# Patient Record
Sex: Female | Born: 1985 | Race: Black or African American | Hispanic: No | Marital: Single | Smoking: Current every day smoker
Health system: Southern US, Community
[De-identification: ages and names within clinical notes are randomized; demographics above are authoritative.]

---

## 2016-06-03 ENCOUNTER — Encounter (HOSPITAL_COMMUNITY): Payer: Self-pay | Admitting: Family Medicine

## 2016-06-03 ENCOUNTER — Ambulatory Visit (INDEPENDENT_AMBULATORY_CARE_PROVIDER_SITE_OTHER): Payer: PRIVATE HEALTH INSURANCE

## 2016-06-03 ENCOUNTER — Ambulatory Visit (HOSPITAL_COMMUNITY)
Admission: EM | Admit: 2016-06-03 | Discharge: 2016-06-03 | Disposition: A | Discharge status: Home / Self Care | Payer: PRIVATE HEALTH INSURANCE | Attending: Family Medicine | Admitting: Family Medicine

## 2016-06-03 DIAGNOSIS — M25561 Pain in right knee: Secondary | ICD-10-CM | POA: Diagnosis not present

## 2016-06-03 MED ORDER — DICLOFENAC SODIUM 75 MG PO TBEC: 75.0000 mg | DELAYED_RELEASE_TABLET | Freq: Two times a day (BID) | ORAL | 0 refills | Status: DC
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
## 2016-06-03 NOTE — ED Notes (Signed)
Med  Knee   Sleeve  applied

## 2016-06-03 NOTE — Discharge Instructions (Signed)
There are no acute findings on your Xray such as a fracture or dislocation. Rest the affected knee, keep it elevated, apply ice 15 minutes at a time and use compression such as a brace or ACE bandages. I have written a prescription for diclofenac for pain. Take it twice a day. You may also use over the counter tylenol ever 4-6 hours for additional pain control if needed.  Should your symptoms fail to improve or worsen follow up with your primary care provider or return to clinic.

## 2016-06-04 ENCOUNTER — Telehealth (HOSPITAL_COMMUNITY): Payer: Self-pay | Admitting: Emergency Medicine

## 2016-06-04 NOTE — Telephone Encounter (Signed)
Discussed details of work note.  Patient to return to work on 1/11.  Patient agreeable

## 2016-07-29 ENCOUNTER — Emergency Department (HOSPITAL_COMMUNITY)
Admission: EM | Admit: 2016-07-29 | Discharge: 2016-07-29 | Disposition: A | Discharge status: Home / Self Care | Payer: PRIVATE HEALTH INSURANCE | Attending: Emergency Medicine | Admitting: Emergency Medicine

## 2016-07-29 ENCOUNTER — Emergency Department (HOSPITAL_COMMUNITY): Payer: PRIVATE HEALTH INSURANCE

## 2016-07-29 ENCOUNTER — Encounter (HOSPITAL_COMMUNITY): Payer: Self-pay | Admitting: Emergency Medicine

## 2016-07-29 DIAGNOSIS — J101 Influenza due to other identified influenza virus with other respiratory manifestations: Secondary | ICD-10-CM | POA: Insufficient documentation

## 2016-07-29 LAB — URINALYSIS, ROUTINE W REFLEX MICROSCOPIC
BILIRUBIN URINE: NEGATIVE
GLUCOSE, UA: NEGATIVE mg/dL
KETONES UR: NEGATIVE mg/dL
NITRITE: NEGATIVE
PROTEIN: NEGATIVE mg/dL
Specific Gravity, Urine: 1.016 (ref 1.005–1.030)
pH: 8 (ref 5.0–8.0)

## 2016-07-29 LAB — COMPREHENSIVE METABOLIC PANEL
ALBUMIN: 4.1 g/dL (ref 3.5–5.0)
ALK PHOS: 36 U/L — AB (ref 38–126)
ALT: 35 U/L (ref 14–54)
ANION GAP: 6 (ref 5–15)
AST: 40 U/L (ref 15–41)
BILIRUBIN TOTAL: 0.6 mg/dL (ref 0.3–1.2)
BUN: 10 mg/dL (ref 6–20)
CALCIUM: 9.5 mg/dL (ref 8.9–10.3)
CO2: 25 mmol/L (ref 22–32)
Chloride: 103 mmol/L (ref 101–111)
Creatinine, Ser: 0.98 mg/dL (ref 0.44–1.00)
GFR calc non Af Amer: >60 mL/min (ref >60)
Glucose, Bld: 91 mg/dL (ref 65–99)
POTASSIUM: 4.5 mmol/L (ref 3.5–5.1)
Sodium: 134 mmol/L — ABNORMAL LOW (ref 135–145)
TOTAL PROTEIN: 7.1 g/dL (ref 6.5–8.1)

## 2016-07-29 LAB — CBC
HEMATOCRIT: 37.5 % (ref 36.0–46.0)
HEMOGLOBIN: 12.3 g/dL (ref 12.0–15.0)
MCH: 27.7 pg (ref 26.0–34.0)
MCHC: 32.8 g/dL (ref 30.0–36.0)
MCV: 84.5 fL (ref 78.0–100.0)
Platelets: 236 10*3/uL (ref 150–400)
RBC: 4.44 MIL/uL (ref 3.87–5.11)
RDW: 15.3 % (ref 11.5–15.5)
WBC: 7.2 10*3/uL (ref 4.0–10.5)

## 2016-07-29 LAB — INFLUENZA PANEL BY PCR (TYPE A & B)
Influenza A By PCR: NEGATIVE
Influenza B By PCR: POSITIVE — AB

## 2016-07-29 LAB — RAPID STREP SCREEN (MED CTR MEBANE ONLY): STREPTOCOCCUS, GROUP A SCREEN (DIRECT): NEGATIVE

## 2016-07-29 LAB — I-STAT BETA HCG BLOOD, ED (MC, WL, AP ONLY): I-stat hCG, quantitative: <5 m[IU]/mL (ref <5)

## 2016-07-29 LAB — I-STAT CG4 LACTIC ACID, ED: Lactic Acid, Venous: 0.73 mmol/L (ref 0.5–1.9)

## 2016-07-29 LAB — LIPASE, BLOOD: Lipase: 25 U/L (ref 11–51)

## 2016-07-29 MED ORDER — OSELTAMIVIR PHOSPHATE 75 MG PO CAPS
75.0000 mg | ORAL_CAPSULE | Freq: Once | ORAL | Status: AC
  Administered 2016-07-29: 75 mg via ORAL
  Filled 2016-07-29: qty 1

## 2016-07-29 MED ORDER — SODIUM CHLORIDE 0.9 % IV BOLUS (SEPSIS)
1000.0000 mL | Freq: Once | INTRAVENOUS | Status: AC
  Administered 2016-07-29: 1000 mL via INTRAVENOUS

## 2016-07-29 MED ORDER — OSELTAMIVIR PHOSPHATE 75 MG PO CAPS: 75.0000 mg | ORAL_CAPSULE | Freq: Two times a day (BID) | ORAL | 0 refills | Status: DC

## 2016-07-29 MED ORDER — PROMETHAZINE HCL 25 MG PO TABS: 25.0000 mg | ORAL_TABLET | Freq: Four times a day (QID) | ORAL | 0 refills | Status: DC | PRN

## 2016-07-29 MED ORDER — HYDROCODONE-ACETAMINOPHEN 5-325 MG PO TABS: ORAL_TABLET | ORAL | 0 refills | Status: DC

## 2016-07-29 MED ORDER — ACETAMINOPHEN 325 MG PO TABS
650.0000 mg | ORAL_TABLET | Freq: Once | ORAL | Status: AC | PRN
  Administered 2016-07-29: 650 mg via ORAL
  Filled 2016-07-29: qty 2

## 2016-07-29 NOTE — ED Notes (Signed)
Pt tolerated PO challenge with no issues, no nausea. 

## 2016-07-29 NOTE — ED Provider Notes (Signed)
[facility]-EMERGENCY DEPT Provider Note   CSN: [id number] Arrival date & time: 07/29/16  0827     History   Chief Complaint Chief Complaint  Patient presents with  . Emesis  . Fever    HPI   Blood pressure 134/88, pulse 91, temperature 101.4 F (38.6 C), temperature source Oral, resp. rate 18, weight 95.4 kg, last menstrual period 07/29/2016, SpO2 100 %.  Stacey Roth is a 31 y.o. female no significant past medical history complaining of nonbloody, nonbilious, no coffee-ground emesis with associated non-melanotic stool with fever chills dry cough, myalgia onset 2 days ago. Positive sick contacts in that her children are also ill. She denies any focal chest pain, abdominal pain. On review of systems she endorses headache with no cervicalgia or stiff neck.  There are no active problems to display for this patient.   History reviewed. No pertinent surgical history.  OB History    No data available       Home Medications    Prior to Admission medications   Medication Sig Start Date End Date Taking? Authorizing Provider  HYDROcodone-acetaminophen (NORCO/VICODIN) 5-325 MG tablet Take 1-2 tablets by mouth every 6 hours as needed for pain and/or cough. 07/29/16   Wynona Duhamel, PA-C  oseltamivir (TAMIFLU) 75 MG capsule Take 1 capsule (75 mg total) by mouth every 12 (twelve) hours. 07/29/16   Lauralye Kinn, PA-C  promethazine (PHENERGAN) 25 MG tablet Take 1 tablet (25 mg total) by mouth every 6 (six) hours as needed for nausea or vomiting. 07/29/16   Joni Reining Desmund Elman, PA-C    Family History No family history on file.  Social History Social History  Substance Use Topics  . Smoking status: Never Smoker  . Smokeless tobacco: Never Used  . Alcohol use Not on file     Allergies   Lac bovis   Review of Systems Review of Systems  10 systems reviewed and found to be negative, except as noted in the HPI.   Physical Exam Updated Vital Signs BP 131/89 (BP Location: Right  Arm)   Pulse 86   Temp 99.8 F (37.7 C) (Oral)   Resp 18   Wt 95.4 kg   LMP 07/29/2016   SpO2 99%   Physical Exam  Constitutional: She appears well-developed and well-nourished.  HENT:  Head: Normocephalic.  Right Ear: External ear normal.  Left Ear: External ear normal.  Mouth/Throat: Oropharynx is clear and moist. No oropharyngeal exudate.  No drooling or stridor. Posterior pharynx mildly erythematous no significant tonsillar hypertrophy. No exudate. Soft palate rises symmetrically. No TTP or induration under tongue.   No tenderness to palpation of frontal or bilateral maxillary sinuses.  Mild mucosal edema in the nares with scant rhinorrhea.  Bilateral tympanic membranes with normal architecture and good light reflex.    Eyes: Conjunctivae and EOM are normal. Pupils are equal, round, and reactive to light.  Neck: Normal range of motion. Neck supple.  Cardiovascular: Normal rate and regular rhythm.   Pulmonary/Chest: Effort normal and breath sounds normal. No stridor. No respiratory distress. She has no wheezes. She has no rales. She exhibits no tenderness.  Abdominal: Soft. There is no tenderness. There is no rebound and no guarding.  Nursing note and vitals reviewed.    ED Treatments / Results  Labs (all labs ordered are listed, but only abnormal results are displayed) Labs Reviewed  COMPREHENSIVE METABOLIC PANEL - Abnormal; Notable for the following:       Result Value   Sodium 134 (*)  Alkaline Phosphatase 36 (*)    All other components within normal limits  URINALYSIS, ROUTINE W REFLEX MICROSCOPIC - Abnormal; Notable for the following:    APPearance HAZY (*)    Hgb urine dipstick MODERATE (*)    Leukocytes, UA TRACE (*)    Bacteria, UA RARE (*)    Squamous Epithelial / LPF 6-30 (*)    All other components within normal limits  INFLUENZA PANEL BY PCR (TYPE A & B) - Abnormal; Notable for the following:    Influenza B By PCR POSITIVE (*)    All other  components within normal limits  RAPID STREP SCREEN (NOT AT [facility])  CULTURE, GROUP A STREP ([facility])  LIPASE, BLOOD  CBC  I-STAT CG4 LACTIC ACID, ED  I-STAT BETA HCG BLOOD, ED ([facility], [facility], [facility] ONLY)  I-STAT CG4 LACTIC ACID, ED    EKG  EKG Interpretation None       Radiology Dg Chest 2 View  Result Date: 07/29/2016 CLINICAL DATA:  Dry cough with SOB. Pt gets winded upon any physical exertion. Pt has gastric reflux. Not taking any prescribed meds. No hx of HTN. Not diabetic. Nonsmoker. Pt has sore throat as well, say it feels swollen. EXAM: CHEST  2 VIEW COMPARISON:  None. FINDINGS: The heart size and mediastinal contours are within normal limits. Both lungs are clear. The visualized skeletal structures are unremarkable. IMPRESSION: No active cardiopulmonary disease. No evidence of pneumonia or pulmonary edema. Electronically Signed   By: Bary RichardStan  Maynard M.D.   On: 07/29/2016 09:09    Procedures Procedures (including critical care time)  Medications Ordered in ED Medications  oseltamivir (TAMIFLU) capsule 75 mg (not administered)  acetaminophen (TYLENOL) tablet 650 mg (650 mg Oral Given 07/29/16 0917)  sodium chloride 0.9 % bolus 1,000 mL (0 mLs Intravenous Stopped 07/29/16 1247)     Initial Impression / Assessment and Plan / ED Course  I have reviewed the triage vital signs and the nursing notes.  Pertinent labs & imaging results that were available during my care of the patient were reviewed by me and considered in my medical decision making (see chart for details).     Vitals:   07/29/16 0840 07/29/16 0948 07/29/16 1044 07/29/16 1124  BP: 134/88  121/75 131/89  Pulse: 91  90 86  Resp: 18  18 18   Temp: 101.4 F (38.6 C) 101.4 F (38.6 C) 99.3 F (37.4 C) 99.8 F (37.7 C)  TempSrc: Oral Oral Oral Oral  SpO2: 100%  100% 99%  Weight: 95.4 kg       Medications  oseltamivir (TAMIFLU) capsule 75 mg (not administered)  acetaminophen (TYLENOL) tablet 650 mg (650 mg Oral Given 07/29/16  0917)  sodium chloride 0.9 % bolus 1,000 mL (0 mLs Intravenous Stopped 07/29/16 1247)    [name] is 31 y.o. female presenting with nausea vomiting diarrhea and upper respiratory infection consistent with influenza, patient is febrile here with a fever of over 101, is appropriately defervesced after acetaminophen. She is tolerating By mouth's. Blood work reassuring, chest x-ray clear, she saturating well on room air. Flu be positive. Patient counseled on infection control precautions verbalized understanding and teach back technique.  Evaluation does not show pathology that would require ongoing emergent intervention or inpatient treatment. Pt is hemodynamically stable and mentating appropriately. Discussed findings and plan with patient/guardian, who agrees with care plan. All questions answered. Return precautions discussed and outpatient follow up given.      Final Clinical Impressions(s) / ED Diagnoses  Final diagnoses:  Influenza B    New Prescriptions New Prescriptions   HYDROCODONE-ACETAMINOPHEN (NORCO/VICODIN) 5-325 MG TABLET    Take 1-2 tablets by mouth every 6 hours as needed for pain and/or cough.   OSELTAMIVIR (TAMIFLU) 75 MG CAPSULE    Take 1 capsule (75 mg total) by mouth every 12 (twelve) hours.   PROMETHAZINE (PHENERGAN) 25 MG TABLET    Take 1 tablet (25 mg total) by mouth every 6 (six) hours as needed for nausea or vomiting.     Wynetta Emery, PA-C 07/29/16 1248    Canary Brim Tegeler, MD 07/29/16 [id number]

## 2016-07-29 NOTE — ED Notes (Signed)
Water offered to pt for PO challenge. 

## 2016-07-29 NOTE — Discharge Instructions (Signed)

## 2016-07-29 NOTE — ED Triage Notes (Signed)
Pt reports emesis and fever x 2 days. Chest congestions , dry cough x 2 days. Chest pain when coughing. Alert and oriented x 4.

## 2016-07-31 LAB — CULTURE, GROUP A STREP (THRC)

## 2017-03-10 ENCOUNTER — Emergency Department (HOSPITAL_COMMUNITY)
Admission: EM | Admit: 2017-03-10 | Discharge: 2017-03-10 | Disposition: A | Discharge status: Home / Self Care | Payer: PRIVATE HEALTH INSURANCE | Attending: Emergency Medicine | Admitting: Emergency Medicine

## 2017-03-10 ENCOUNTER — Encounter (HOSPITAL_COMMUNITY): Payer: Self-pay

## 2017-03-10 DIAGNOSIS — K5900 Constipation, unspecified: Secondary | ICD-10-CM | POA: Insufficient documentation

## 2017-03-10 DIAGNOSIS — K649 Unspecified hemorrhoids: Secondary | ICD-10-CM | POA: Insufficient documentation

## 2017-03-10 MED ORDER — LIDOCAINE 5 % EX OINT: 1.0000 "application " | TOPICAL_OINTMENT | CUTANEOUS | 0 refills | Status: DC | PRN

## 2017-03-10 MED ORDER — BISACODYL 5 MG PO TBEC: 5.0000 mg | DELAYED_RELEASE_TABLET | ORAL | 0 refills | Status: DC

## 2017-03-10 MED ORDER — HYDROCORTISONE 2.5 % RE CREA: 1.0000 "application " | TOPICAL_CREAM | Freq: Two times a day (BID) | RECTAL | 0 refills | Status: DC

## 2017-03-10 NOTE — ED Provider Notes (Signed)
[facility] EMERGENCY DEPARTMENT Provider Note   CSN: [id number] Arrival date & time: 03/10/17  1331     History   Chief Complaint No chief complaint on file.   HPI  Stacey Roth is a 31 y.o. Female with one previous episode of hemorrhoids, presents complaining of 2 days of rectal pain. Pt describes pain as a constant ache and sensation that something is poking out of her rectum. Pt denies any rectal bleeding. Pain is worse with prolonged sitting and wiping, no meds PTA to treat symptoms. Patient reports she often strains a lot when trying to have a bowel movement and spends a prolonged amount of time of the toilet. Pt states she is frequently constipated and BMs are hard. Denies any rectal or vaginal discharge, no abdominal pain.      History reviewed. No pertinent past medical history.  There are no active problems to display for this patient.   History reviewed. No pertinent surgical history.  OB History    No data available       Home Medications    Prior to Admission medications   Medication Sig Start Date End Date Taking? Authorizing Provider  HYDROcodone-acetaminophen (NORCO/VICODIN) 5-325 MG tablet Take 1-2 tablets by mouth every 6 hours as needed for pain and/or cough. 07/29/16   Pisciotta, Joni Reining, PA-C  oseltamivir (TAMIFLU) 75 MG capsule Take 1 capsule (75 mg total) by mouth every 12 (twelve) hours. 07/29/16   Pisciotta, Joni Reining, PA-C  promethazine (PHENERGAN) 25 MG tablet Take 1 tablet (25 mg total) by mouth every 6 (six) hours as needed for nausea or vomiting. 07/29/16   Pisciotta, Mardella Layman    Family History No family history on file.  Social History Social History  Substance Use Topics  . Smoking status: Never Smoker  . Smokeless tobacco: Never Used  . Alcohol use Not on file     Allergies   Lac bovis   Review of Systems Review of Systems  Constitutional: Negative for chills and fever.  Gastrointestinal: Positive for  constipation and rectal pain. Negative for abdominal pain, anal bleeding, blood in stool, nausea and vomiting.  Genitourinary: Negative for dysuria.  Musculoskeletal: Negative for back pain.     Physical Exam Updated Vital Signs BP (!) 127/97 (BP Location: Left Arm)   Pulse 81   Temp 98.6 F (37 C) (Oral)   Resp 16   Ht  (1.676 m)   Wt 108.4 kg (239 lb)   SpO2 100%   BMI 38.58 kg/m   Physical Exam  Constitutional: She appears well-developed and well-nourished. No distress.  HENT:  Head: Normocephalic and atraumatic.  Eyes: Right eye exhibits no discharge. Left eye exhibits no discharge.  Pulmonary/Chest: Effort normal. No respiratory distress.  Genitourinary:     Neurological: She is alert. Coordination normal.  Skin: She is not diaphoretic.  Psychiatric: She has a normal mood and affect. Her behavior is normal.  Nursing note and vitals reviewed.    ED Treatments / Results  Labs (all labs ordered are listed, but only abnormal results are displayed) Labs Reviewed - No data to display  EKG  EKG Interpretation None       Radiology No results found.  Procedures Procedures (including critical care time)  Medications Ordered in ED Medications - No data to display   Initial Impression / Assessment and Plan / ED Course  I have reviewed the triage vital signs and the nursing notes.  Pertinent labs & imaging results that were  available during my care of the patient were reviewed by me and considered in my medical decision making (see chart for details)  Pt presents with 2 days of rectal pain, no bleeding. Vitals normal and pt well-appearing. Presentation consistent with hemorrhoid, no evidence of anal fissure, or perirectal abscess. Pt stable for discharge home with stool softeners, lidocaine and hydrocortisone creams. Encouraged pt to do sitz baths 3-4 times a day. Pt to follow up with primary doctor if symptoms not improving in 5-7 days, stressed importance  of soft stools and increasing fiber intake. Return precautions provided and pt expressed understanding.  Final Clinical Impressions(s) / ED Diagnoses   Final diagnoses:  Hemorrhoids, unspecified hemorrhoid type    New Prescriptions Discharge Medication List as of 03/10/2017  4:00 PM    START taking these medications   Details  bisacodyl (DULCOLAX) 5 MG EC tablet Take 1 tablet (5 mg total) by mouth 1 day or 1 dose., Starting Tue 03/10/2017, Print    hydrocortisone (ANUSOL-HC) 2.5 % rectal cream Place 1 application rectally 2 (two) times daily., Starting Tue 03/10/2017, Print    lidocaine (XYLOCAINE) 5 % ointment Apply 1 application topically as needed., Starting Tue 03/10/2017, Print         Dartha Lodge, PA-C 03/11/17 1324    Rolland Porter, MD 03/15/17 2354

## 2017-03-10 NOTE — ED Triage Notes (Signed)
Patient here with recurrent hemorrhoid. Reports pain with wiping, no bleeding, NAD

## 2017-03-10 NOTE — Discharge Instructions (Signed)
Use lidocaine and hydrocortisone creams, as well as stool softener, you may also use Miralax to help with constipation. Use warm water soaks 3-4 times a day. If symptoms are not improving please follow up with community wellness center.  Contact a doctor if: You have any of these: Pain and swelling that do not get better with treatment or medicine. Bleeding that will not stop. Trouble pooping or you cannot poop. Pain or swelling outside the area of the hemorrhoids.

## 2017-04-30 ENCOUNTER — Emergency Department (HOSPITAL_COMMUNITY)
Admission: EM | Admit: 2017-04-30 | Discharge: 2017-04-30 | Disposition: A | Discharge status: Home / Self Care | Payer: PRIVATE HEALTH INSURANCE | Attending: Emergency Medicine | Admitting: Emergency Medicine

## 2017-04-30 ENCOUNTER — Encounter (HOSPITAL_COMMUNITY): Payer: Self-pay | Admitting: Emergency Medicine

## 2017-04-30 DIAGNOSIS — F17210 Nicotine dependence, cigarettes, uncomplicated: Secondary | ICD-10-CM | POA: Diagnosis not present

## 2017-04-30 DIAGNOSIS — M25561 Pain in right knee: Secondary | ICD-10-CM | POA: Diagnosis not present

## 2017-04-30 MED ORDER — IBUPROFEN 200 MG PO TABS: 600.0000 mg | ORAL_TABLET | Freq: Once | ORAL | Status: DC

## 2017-04-30 MED ORDER — ACETAMINOPHEN 325 MG PO TABS
650.0000 mg | ORAL_TABLET | Freq: Once | ORAL | Status: DC
Start: 2017-04-30 — End: 2017-04-30

## 2017-04-30 NOTE — ED Triage Notes (Signed)
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 Patient denies any injuries to cause pain. Patient reports now having pain in left knees and feet after she works.  Patient tried brace, elevating leg, icing but nothing helps with pain.

## 2017-04-30 NOTE — ED Provider Notes (Signed)
[facility]-EMERGENCY DEPT Provider Note   CSN: [id number] Arrival date & time: 04/30/17  0732     History   Chief Complaint Chief Complaint  Patient presents with  . Knee Pain    HPI Stacey Roth is a 31 y.o. female.  HPI Patient is a 31 year old female who presents to the emergency department with complaints of right knee pain and intermittent right knee swelling.  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  She has not tried any medications prior to arrival.  She does not have a primary care physician.  She is currently without health insurance.  She is never seen a sports medicine physician or an orthopedic surgeon regarding her right knee pain.  She was seen in the emergency department in January 2018 with right knee pain in her x-ray at that time demonstrates no signs of arthritis.  No fevers or chills.  No history of gout.      History reviewed. No pertinent past medical history.  There are no active problems to display for this patient.   History reviewed. No pertinent surgical history.  OB History    No data available       Home Medications    Prior to Admission medications   Medication Sig Start Date End Date Taking? Authorizing Provider  bisacodyl (DULCOLAX) 5 MG EC tablet Take 1 tablet (5 mg total) by mouth 1 day or 1 dose. 03/10/17   Dartha LodgeFord, Kelsey N, PA-C  HYDROcodone-acetaminophen (NORCO/VICODIN) 5-325 MG tablet Take 1-2 tablets by mouth every 6 hours as needed for pain and/or cough. 07/29/16   Pisciotta, Joni ReiningNicole, PA-C  hydrocortisone (ANUSOL-HC) 2.5 % rectal cream Place 1 application rectally 2 (two) times daily. 03/10/17   Dartha LodgeFord, Kelsey N, PA-C  lidocaine (XYLOCAINE) 5 % ointment Apply 1 application topically as needed. 03/10/17   Dartha LodgeFord, Kelsey N, PA-C  oseltamivir (TAMIFLU) 75 MG capsule Take 1 capsule (75 mg total) by mouth every 12  (twelve) hours. 07/29/16   Pisciotta, Joni ReiningNicole, PA-C  promethazine (PHENERGAN) 25 MG tablet Take 1 tablet (25 mg total) by mouth every 6 (six) hours as needed for nausea or vomiting. 07/29/16   Pisciotta, Mardella LaymanNicole, PA-C    Family History No family history on file.  Social History Social History   Tobacco Use  . Smoking status: Current Every Day Smoker    Types: Cigarettes  . Smokeless tobacco: Never Used  Substance Use Topics  . Alcohol use: Yes    Comment: occasional   . Drug use: Not on file     Allergies   Lac bovis   Review of Systems Review of Systems  All other systems reviewed and are negative.    Physical Exam Updated Vital Signs BP (!) 117/95 (BP Location: Left Arm)   Pulse 85   Temp 97.8 F (36.6 C) (Oral)   Resp 18   Ht 5\' 6"  (1.676 m)   Wt 108.4 kg (239 lb)   LMP 04/05/2017   SpO2 99%   BMI 38.58 kg/m   Physical Exam  Constitutional: She is oriented to person, place, and time. She appears well-developed and well-nourished. No distress.  HENT:  Head: Normocephalic and atraumatic.  Eyes: EOM are normal.  Neck: Normal range of motion.  Pulmonary/Chest: Effort normal.  Abdominal: Soft.  Musculoskeletal:  Small joint effusion right knee.  Full range of motion of  right hip, right knee, right ankle.  No erythema or warmth of the right knee.  No mechanical symptoms elicited  Neurological: She is alert and oriented to person, place, and time.  Skin: Skin is warm and dry.  Psychiatric: She has a normal mood and affect. Judgment normal.  Nursing note and vitals reviewed.    ED Treatments / Results  Labs (all labs ordered are listed, but only abnormal results are displayed) Labs Reviewed - No data to display  EKG  EKG Interpretation None       Radiology No results found.  Procedures Procedures (including critical care time)  Medications Ordered in ED Medications - No data to display   Initial Impression / Assessment and Plan / ED Course  I  have reviewed the triage vital signs and the nursing notes.  Pertinent labs & imaging results that were available during my care of the patient were reviewed by me and considered in my medical decision making (see chart for details).     Nonspecific right knee pain.  Outpatient primary care follow-up.  [facility].  She will likely need evaluation by sports medicine physician after referral by primary care physician.  Recommended anti-inflammatories, rest, ice, elevation.  No trauma.  No indication for acute x-ray today  Final Clinical Impressions(s) / ED Diagnoses   Final diagnoses:  Acute pain of right knee    ED Discharge Orders    None       Azalia Bilisampos, Jaimon Bugaj, MD 04/30/17 [phone]

## 2017-06-22 ENCOUNTER — Emergency Department (HOSPITAL_COMMUNITY): Payer: Self-pay

## 2017-06-22 ENCOUNTER — Encounter (HOSPITAL_COMMUNITY): Payer: Self-pay

## 2017-06-22 ENCOUNTER — Other Ambulatory Visit: Payer: Self-pay

## 2017-06-22 DIAGNOSIS — Z79899 Other long term (current) drug therapy: Secondary | ICD-10-CM | POA: Insufficient documentation

## 2017-06-22 DIAGNOSIS — J189 Pneumonia, unspecified organism: Secondary | ICD-10-CM | POA: Insufficient documentation

## 2017-06-22 DIAGNOSIS — F17210 Nicotine dependence, cigarettes, uncomplicated: Secondary | ICD-10-CM | POA: Insufficient documentation

## 2017-06-22 LAB — BASIC METABOLIC PANEL
ANION GAP: 10 (ref 5–15)
BUN: 13 mg/dL (ref 6–20)
CO2: 24 mmol/L (ref 22–32)
Calcium: 9.5 mg/dL (ref 8.9–10.3)
Chloride: 103 mmol/L (ref 101–111)
Creatinine, Ser: 0.84 mg/dL (ref 0.44–1.00)
GFR calc Af Amer: >60 mL/min (ref >60)
Glucose, Bld: 108 mg/dL — ABNORMAL HIGH (ref 65–99)
POTASSIUM: 3.9 mmol/L (ref 3.5–5.1)
SODIUM: 137 mmol/L (ref 135–145)

## 2017-06-22 LAB — CBC
HEMATOCRIT: 38.6 % (ref 36.0–46.0)
Hemoglobin: 12.6 g/dL (ref 12.0–15.0)
MCH: 28 pg (ref 26.0–34.0)
MCHC: 32.6 g/dL (ref 30.0–36.0)
MCV: 85.8 fL (ref 78.0–100.0)
Platelets: 266 10*3/uL (ref 150–400)
RBC: 4.5 MIL/uL (ref 3.87–5.11)
RDW: 15.8 % — AB (ref 11.5–15.5)
WBC: 8.1 10*3/uL (ref 4.0–10.5)

## 2017-06-22 LAB — I-STAT TROPONIN, ED: Troponin i, poc: 0 ng/mL (ref 0.00–0.08)

## 2017-06-22 LAB — I-STAT BETA HCG BLOOD, ED (MC, WL, AP ONLY)

## 2017-06-22 NOTE — ED Triage Notes (Signed)
Pt reports that for the past week she has had central CP with SOB. No other cardiac symptoms. 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

## 2017-06-23 ENCOUNTER — Emergency Department (HOSPITAL_COMMUNITY)
Admission: EM | Admit: 2017-06-23 | Discharge: 2017-06-23 | Disposition: A | Discharge status: Home / Self Care | Payer: Self-pay | Attending: Emergency Medicine | Admitting: Emergency Medicine

## 2017-06-23 DIAGNOSIS — J189 Pneumonia, unspecified organism: Secondary | ICD-10-CM

## 2017-06-23 NOTE — ED Provider Notes (Signed)
[facility] EMERGENCY DEPARTMENT Provider Note   CSN: [id number] Arrival date & time: 06/22/17  2236     History   Chief Complaint Chief Complaint  Patient presents with  . Chest Pain    HPI Stacey Roth is a 32 y.o. female.   32 year old female with no significant past medical history presents to the emergency department for evaluation of chest pain.  She reports central chest pain which begins in her right upper chest and migrates to her left chest.  She has had intermittent symptoms over the past week.  She notices worsening symptoms when she is at work.  She states that she works at [employer] and is also using many chemicals.  Her symptoms worsen when she is not wearing a mask.  She notes associated shortness of breath.  She has improvement to her pain and breathing when she is able to go outside in the fresh air.  She denies taking any medications for her symptoms.  No hemoptysis, leg swelling, recent surgeries or hospitalizations, use of birth control.  She further denies congestion and fevers.  She is a daily smoker.  No family history of ACS, per patient.  No personal history of hypertension, diabetes, dyslipidemia.      History reviewed. No pertinent past medical history.  There are no active problems to display for this patient.   History reviewed. No pertinent surgical history.  OB History    No data available       Home Medications    Prior to Admission medications   Medication Sig Start Date End Date Taking? Authorizing Provider  bisacodyl (DULCOLAX) 5 MG EC tablet Take 1 tablet (5 mg total) by mouth 1 day or 1 dose. 03/10/17   Dartha Lodge, PA-C  HYDROcodone-acetaminophen (NORCO/VICODIN) 5-325 MG tablet Take 1-2 tablets by mouth every 6 hours as needed for pain and/or cough. 07/29/16   Pisciotta, Joni Reining, PA-C  hydrocortisone (ANUSOL-HC) 2.5 % rectal cream Place 1 application rectally 2 (two) times daily. 03/10/17   Dartha Lodge,  PA-C  lidocaine (XYLOCAINE) 5 % ointment Apply 1 application topically as needed. 03/10/17   Dartha Lodge, PA-C  oseltamivir (TAMIFLU) 75 MG capsule Take 1 capsule (75 mg total) by mouth every 12 (twelve) hours. 07/29/16   Pisciotta, Joni Reining, PA-C  promethazine (PHENERGAN) 25 MG tablet Take 1 tablet (25 mg total) by mouth every 6 (six) hours as needed for nausea or vomiting. 07/29/16   Pisciotta, Mardella Layman    Family History No family history on file.  Social History Social History   Tobacco Use  . Smoking status: Current Every Day Smoker    Types: Cigarettes  . Smokeless tobacco: Never Used  Substance Use Topics  . Alcohol use: Yes    Comment: occasional   . Drug use: Not on file     Allergies   Lac bovis   Review of Systems Review of Systems Ten systems reviewed and are negative for acute change, except as noted in the HPI.    Physical Exam Updated Vital Signs BP 139/79   Pulse 74   Temp 98.3 F (36.8 C) (Oral)   Resp 17   SpO2 98%   Physical Exam  Constitutional: She is oriented to person, place, and time. She appears well-developed and well-nourished. No distress.  Nontoxic appearing and in NAD  HENT:  Head: Normocephalic and atraumatic.  Eyes: Conjunctivae and EOM are normal. No scleral icterus.  Neck: Normal range of motion.  Cardiovascular: Normal rate, regular rhythm and intact distal pulses.  Pulmonary/Chest: Effort normal. No stridor. No respiratory distress. She has no wheezes. She has no rales.  Lungs CTAB. Respirations even and unlabored.  Musculoskeletal: Normal range of motion.  Neurological: She is alert and oriented to person, place, and time. She exhibits normal muscle tone. Coordination normal.  Skin: Skin is warm and dry. No rash noted. She is not diaphoretic. No erythema. No pallor.  Psychiatric: She has a normal mood and affect. Her behavior is normal.  Nursing note and vitals reviewed.    ED Treatments / Results  Labs (all labs  ordered are listed, but only abnormal results are displayed) Labs Reviewed  BASIC METABOLIC PANEL - Abnormal; Notable for the following components:      Result Value   Glucose, Bld 108 (*)    All other components within normal limits  CBC - Abnormal; Notable for the following components:   RDW 15.8 (*)    All other components within normal limits  I-STAT TROPONIN, ED  I-STAT BETA HCG BLOOD, ED ([facility], [facility], [facility] ONLY)    EKG  EKG Interpretation  Date/Time:  Monday June 22 2017 22:41:56 EST Ventricular Rate:  83 PR Interval:  138 QRS Duration: 82 QT Interval:  354 QTC Calculation: 415 R Axis:   43 Text Interpretation:  Normal sinus rhythm Nonspecific T wave abnormality Abnormal ECG No previous tracing Confirmed by Gilda CreasePollina, Christopher J [id number]) on 06/23/2017 3:15:28 AM       Radiology Dg Chest 2 View  Result Date: 06/22/2017 CLINICAL DATA:  Central chest pain and shortness of breath for a week. EXAM: CHEST  2 VIEW COMPARISON:  07/29/2016 FINDINGS: Shallow inspiration. The heart size and mediastinal contours are within normal limits. Both lungs are clear. The visualized skeletal structures are unremarkable. IMPRESSION: No active cardiopulmonary disease. Electronically Signed   By: Burman NievesWilliam  Stevens M.D.   On: 06/22/2017 23:32    Procedures Procedures (including critical care time)  Medications Ordered in ED Medications - No data to display   Initial Impression / Assessment and Plan / ED Course  I have reviewed the triage vital signs and the nursing notes.  Pertinent labs & imaging results that were available during my care of the patient were reviewed by me and considered in my medical decision making (see chart for details).     32 year old female presents to the emergency department for evaluation of chest pain.  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  Onset was 1 week ago.  Patient with heart score consistent with low risk of acute coronary event.   Her cardiac workup today is reassuring with a negative troponin.  EKG shows no signs of acute ischemia.  Chest x-ray offers further reassurance without significant cardiopulmonary abnormality.  Patient has no mediastinal widening to suggest dissection.  She is PERC negative with low concern for pulmonary embolus at this time.  Symptoms most consistent with pneumonitis.  Have discussed use of better fitting masks when working.  She has been given a Engineer, building servicesfinancial resource guide for assistance in finding a primary care doctor for follow-up.  Return precautions discussed and provided. Patient discharged in stable condition with no unaddressed concerns.   Final Clinical Impressions(s) / ED Diagnoses   Final diagnoses:  Pneumonitis    ED Discharge Orders    None       Antony MaduraHumes, Zhane Donlan, PA-C 06/23/17 0319    Gilda CreasePollina, Christopher J, MD 06/23/17 [phone]

## 2017-06-23 NOTE — ED Notes (Signed)
Pt. Ambulated with steady gait from waiting area to bed.

## 2017-07-10 ENCOUNTER — Encounter: Payer: Self-pay | Admitting: Family Medicine

## 2017-07-10 ENCOUNTER — Ambulatory Visit (INDEPENDENT_AMBULATORY_CARE_PROVIDER_SITE_OTHER): Payer: Self-pay | Admitting: Family Medicine

## 2017-07-10 VITALS — BP 132/88 | Ht 66.0 in | Wt 230.0 lb

## 2017-07-10 DIAGNOSIS — M25531 Pain in right wrist: Secondary | ICD-10-CM

## 2017-07-10 DIAGNOSIS — M9262 Juvenile osteochondrosis of tarsus, left ankle: Secondary | ICD-10-CM

## 2017-07-10 MED ORDER — DICLOFENAC SODIUM 75 MG PO TBEC: 75.0000 mg | DELAYED_RELEASE_TABLET | Freq: Two times a day (BID) | ORAL | 1 refills | Status: DC

## 2017-07-10 NOTE — Progress Notes (Signed)
Chief complaint: Right wrist pain x 2 weeks  History of present illness: Stacey Roth is a 32 year old right-hand-dominant female who presents to sports medicine office today with chief plaint of right wrist and forearm pain.  She reports that symptoms have been present for approximately 2 weeks.  She does not report of any specific inciting incident, trauma, or injury to explain the pain.  She does work in Diet[employer], reports of repetitive wrist and hand movements.  She reports that these are particularly aggravating factors for her.  She reports that symptoms have been getting worse over the last 2 weeks.  She reports that pain today is at a 6/10.  She reports of also having pain along her right forearm.  She points mostly to the flexor surface of her wrist and forearm as to where she feels the pain.  She does not report of any elbow pain.  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  She does not report of any numbness, tingling, or burning paresthesias.  She does not report of any swelling, warmth, erythema, or ecchymosis in her right arm, wrist, hand, or fingers.  She reports that she did chat with one of her friends, who is a Engineer, civil (consulting), and she recommended her taking some ibuprofen.  She reports that she did take a couple of tablets of ibuprofen, which did not help.  She reports she was given a small wrist brace, which did not help. She reports rest otherwise is only alleviating factor. She does not report of any previous injury to her right wrist.  She does not report of any weakness in her right arm or any weakness in grip strength.  She has not used any ice or heat.  She also reports feeling this pain diffusely in her hand.  She reports occasionally having symptoms on the left side, but nowhere near as bad as the right side.  Review of systems:  As stated above  Her past medical history, surgical history, family history, and social history obtained and  reviewed.  She is otherwise healthy, no medical conditions. She is obese.  She does report of current tobacco use.  She does not report of any procedures/hospitalizations.  She does not report a family history of hypertension or type 2 diabetes.  Medication and allergies reviewed, updated in EMR.  Physical exam: Vital signs are reviewed and are documented in the chart Gen.: Alert, oriented, appears stated age, in no apparent distress HEENT: Moist oral mucosa Respiratory: Normal respirations, able to speak in full sentences Cardiac: Regular rate, distal pulses 2+ Integumentary: No rashes on visible skin:  Neurologic: She does have full strength with elbow flexion, elbow extension, wrist flexion, wrist extension, elbow pronation, elbow supination, finger abduction, finger adduction, and thumb opposition bilaterally, would categorize strength as 5/5, sensation 2+ in bilateral upper extremities Psych: Normal affect, mood is described as good Musculoskeletal: Inspection of right wrist reveals no obvious deformity or muscle atrophy, no warmth, erythema, ecchymosis, or effusion, she is tender to palpation with along the palmar/flexor surface of the right wrist, on both the radial side and the ulnar side, she does have slight tenderness on the dorsal aspect of the right wrist, on both the radial side and the ulnar side as well, she does have diffuse tenderness to palpation along the carpal bones and metacarpals on the right hand, she does have tenderness to palpation along the flexor/volar aspect of the right forearm, she does have pain with  wrist flexion, wrist extension, radial deviation of right wrist, ulnar deviation of right wrist, elbow flexion, elbow extension, pronation, and supination on the right side, grind test negative, Finkelstein positive, Tinel sign negative in the carpal tunnel and Guyon canal, Phalen sign negative; also has Haglund's deformity in the left heel  Assessment and plan: 1. Right  wrist pain, suspect to FCR and FCU tendinopathy 2.  Left heel Haglund's deformity  Plan:  Discussed with Jerney today that it seems that her wrist pain is consistent with overuse and tendinopathy of the FCR and FCU, as she is specifically tender along the flexor surface of the right arm and does have pain with wrist flexion and wrist extension, mainly with wrist flexion though.  I do not see any evidence on history or physical exam today to suggest any fracture or any bony abnormalities otherwise.  I discussed first step in management would be to have her fitted for a better wrist brace.  This was fitted to her satisfaction.  I discussed wearing this while at work, avoiding extremes of wrist flexion and wrist extension.  I will also send in diclofenac 75 mg twice daily.  She is to take this for the next 10-14 days, then daily as needed thereafterwards.  Discussed not to use any other ibuprofen, Motrin, or Aleve while taking this medication.  I discussed using heat and ice.  Briefly today at end of encounter, she also points to her left heel and asks what she can do regarding heel discomfort.  It does appear that she does have a left heel Haglund's deformity.  I did place a heel grip in her left shoe.  This was fitted to her satisfaction.  I discussed if she continues to have heel pain to return the office and will further take a look at it.  I discussed for her wrist to give the above measures 4-6 weeks.  If she is not any better in 4-6 weeks with the wrist or has any other concerns, she is instructed to return to office.   Haynes Kernshristopher Mckensi Redinger, M.D. Primary Care Sports Medicine Fellow [facility]

## 2017-07-10 NOTE — Patient Instructions (Signed)
Try 5 mg of Melatonin to help you sleep. If that's not helping after a few days you can try 10 mg.  You can get this over the counter.

## 2017-07-15 ENCOUNTER — Ambulatory Visit: Payer: Self-pay | Admitting: Family Medicine

## 2017-08-13 ENCOUNTER — Emergency Department (HOSPITAL_COMMUNITY)
Admission: EM | Admit: 2017-08-13 | Discharge: 2017-08-13 | Disposition: A | Discharge status: Home / Self Care | Payer: Medicaid Other | Attending: Emergency Medicine | Admitting: Emergency Medicine

## 2017-08-13 ENCOUNTER — Encounter (HOSPITAL_COMMUNITY): Payer: Self-pay | Admitting: Emergency Medicine

## 2017-08-13 ENCOUNTER — Emergency Department (HOSPITAL_COMMUNITY): Payer: Medicaid Other

## 2017-08-13 ENCOUNTER — Other Ambulatory Visit: Payer: Self-pay

## 2017-08-13 DIAGNOSIS — F17210 Nicotine dependence, cigarettes, uncomplicated: Secondary | ICD-10-CM | POA: Insufficient documentation

## 2017-08-13 DIAGNOSIS — R197 Diarrhea, unspecified: Secondary | ICD-10-CM | POA: Insufficient documentation

## 2017-08-13 DIAGNOSIS — J111 Influenza due to unidentified influenza virus with other respiratory manifestations: Secondary | ICD-10-CM

## 2017-08-13 DIAGNOSIS — R05 Cough: Secondary | ICD-10-CM | POA: Insufficient documentation

## 2017-08-13 DIAGNOSIS — M7918 Myalgia, other site: Secondary | ICD-10-CM | POA: Insufficient documentation

## 2017-08-13 MED ORDER — GUAIFENESIN-DM 100-10 MG/5ML PO SYRP: 5.0000 mL | ORAL_SOLUTION | Freq: Three times a day (TID) | ORAL | 0 refills | Status: DC | PRN

## 2017-08-13 NOTE — ED Triage Notes (Signed)
Pt recently diagnosed with flu verbalizing still having aches, cough, and diarrhea post 5 days of treatment.

## 2017-08-13 NOTE — ED Provider Notes (Signed)
[facility]-EMERGENCY DEPT Provider Note   CSN: [id number] Arrival date & time: 08/13/17  0813     History   Chief Complaint Chief Complaint  Patient presents with  . Flu    HPI Stacey Roth is a 32 y.o. female.  HPI Patient presents with flulike symptoms.  6 days ago had been seen at [facility] and had a positive flu test.  Has been on Tamiflu.  Initially had some nausea but then had worsening nausea and diarrhea.  Still has cough and sore throat.  States now is more production with the cough.  Has had some chills.  States she was supposed to go back to work but has decided to quit the job because she was already on probation.  States she aches all over.  States that she still feels bad.  Mild sore throat.  Continued pain.  No blood in the emesis or stool. History reviewed. No pertinent past medical history.  There are no active problems to display for this patient.   History reviewed. No pertinent surgical history.  OB History   None      Home Medications    Prior to Admission medications   Medication Sig Start Date End Date Taking? Authorizing Provider  bisacodyl (DULCOLAX) 5 MG EC tablet Take 1 tablet (5 mg total) by mouth 1 day or 1 dose. Patient not taking: Reported on 07/10/2017 03/10/17   Dartha LodgeFord, Kelsey N, PA-C  diclofenac (VOLTAREN) 75 MG EC tablet Take 1 tablet (75 mg total) by mouth 2 (two) times daily. 07/10/17   Lake, Christoper P, MD  guaiFENesin-dextromethorphan (ROBITUSSIN DM) 100-10 MG/5ML syrup Take 5 mLs by mouth 3 (three) times daily as needed for cough. 08/13/17   Benjiman CorePickering, Teon Hudnall, MD  HYDROcodone-acetaminophen (NORCO/VICODIN) 5-325 MG tablet Take 1-2 tablets by mouth every 6 hours as needed for pain and/or cough. Patient not taking: Reported on 07/10/2017 07/29/16   Pisciotta, Joni ReiningNicole, PA-C  hydrocortisone (ANUSOL-HC) 2.5 % rectal cream Place 1 application rectally 2 (two) times daily. Patient not taking: Reported on 07/10/2017  03/10/17   Dartha LodgeFord, Kelsey N, PA-C  lidocaine (XYLOCAINE) 5 % ointment Apply 1 application topically as needed. Patient not taking: Reported on 07/10/2017 03/10/17   Dartha LodgeFord, Kelsey N, PA-C  oseltamivir (TAMIFLU) 75 MG capsule Take 1 capsule (75 mg total) by mouth every 12 (twelve) hours. Patient not taking: Reported on 07/10/2017 07/29/16   Pisciotta, Joni ReiningNicole, PA-C  promethazine (PHENERGAN) 25 MG tablet Take 1 tablet (25 mg total) by mouth every 6 (six) hours as needed for nausea or vomiting. Patient not taking: Reported on 07/10/2017 07/29/16   Pisciotta, Joni ReiningNicole, PA-C    Family History No family history on file.  Social History Social History   Tobacco Use  . Smoking status: Current Every Day Smoker    Types: Cigarettes  . Smokeless tobacco: Never Used  Substance Use Topics  . Alcohol use: Yes    Comment: occasional   . Drug use: Not on file     Allergies   Lac bovis   Review of Systems Review of Systems  Constitutional: Positive for appetite change, fatigue and fever.  HENT: Positive for sore throat.   Eyes: Negative for photophobia.  Respiratory: Positive for cough.   Cardiovascular: Negative for chest pain.  Gastrointestinal: Positive for diarrhea, nausea and vomiting. Negative for abdominal pain.  Genitourinary: Negative for flank pain.  Musculoskeletal: Positive for myalgias.  Skin: Negative for wound.  Neurological: Negative for syncope and numbness.  Psychiatric/Behavioral: Negative for confusion.     Physical Exam Updated Vital Signs BP (!) 143/91 (BP Location: Right Arm)   Pulse 87   Temp 98.2 F (36.8 C) (Oral)   Resp 16   LMP 07/30/2017   SpO2 100%   Physical Exam  Constitutional: She appears well-developed.  HENT:  Posterior pharyngeal erythema without exudate  Eyes: Pupils are equal, round, and reactive to light.  Neck: Neck supple.  Cardiovascular: Normal rate.  Pulmonary/Chest:  Mildly harsh breath sounds.  No clearly focal lung sounds.  Abdominal:  Soft.  Musculoskeletal: She exhibits no edema.  Neurological: She is alert.  Skin: Skin is warm. Capillary refill takes less than 2 seconds.  Psychiatric: She has a normal mood and affect.     ED Treatments / Results  Labs (all labs ordered are listed, but only abnormal results are displayed) Labs Reviewed - No data to display  EKG  EKG Interpretation None       Radiology Dg Chest 2 View  Result Date: 08/13/2017 CLINICAL DATA:  Cough and fever for the past week. History of hypertension. Positive smoking history. EXAM: CHEST - 2 VIEW COMPARISON:  Chest x-ray of June 22, 2017 FINDINGS: The lungs are adequately inflated and clear. The heart and pulmonary vascularity are normal. The mediastinum is normal in width. The trachea is midline. There is no pleural effusion. The bony thorax exhibits no acute abnormality. IMPRESSION: There is no active cardiopulmonary disease. Electronically Signed   By: David  [place] M.D.   On: 08/13/2017 09:13    Procedures Procedures (including critical care time)  Medications Ordered in ED Medications - No data to display   Initial Impression / Assessment and Plan / ED Course  I have reviewed the triage vital signs and the nursing notes.  Pertinent labs & imaging results that were available during my care of the patient were reviewed by me and considered in my medical decision making (see chart for details).    Patient with flulike symptoms.  Still has cough and had felt a little worse.  X-ray reassuring.  Has been on Tamiflu.  On Tamiflu increased nausea vomiting diarrhea.  States that however is improved.  Still feeling bad.  X-ray reassuring and no superimposed pneumonia seen.  Well-appearing and will discharge home.  Patient requested a work note also.  Final Clinical Impressions(s) / ED Diagnoses   Final diagnoses:  Influenza    ED Discharge Orders        Ordered    guaiFENesin-dextromethorphan (ROBITUSSIN DM) 100-10 MG/5ML syrup  3  times daily PRN     08/13/17 1018       Benjiman Core, MD 08/13/17 1019

## 2017-11-04 ENCOUNTER — Emergency Department (HOSPITAL_COMMUNITY)
Admission: EM | Admit: 2017-11-04 | Discharge: 2017-11-04 | Disposition: A | Discharge status: Home / Self Care | Payer: Medicaid Other | Attending: Emergency Medicine | Admitting: Emergency Medicine

## 2017-11-04 ENCOUNTER — Encounter (HOSPITAL_COMMUNITY): Payer: Self-pay | Admitting: Emergency Medicine

## 2017-11-04 DIAGNOSIS — M25531 Pain in right wrist: Secondary | ICD-10-CM | POA: Insufficient documentation

## 2017-11-04 DIAGNOSIS — Z79899 Other long term (current) drug therapy: Secondary | ICD-10-CM | POA: Insufficient documentation

## 2017-11-04 DIAGNOSIS — M542 Cervicalgia: Secondary | ICD-10-CM | POA: Insufficient documentation

## 2017-11-04 DIAGNOSIS — F17210 Nicotine dependence, cigarettes, uncomplicated: Secondary | ICD-10-CM | POA: Insufficient documentation

## 2017-11-04 DIAGNOSIS — M62838 Other muscle spasm: Secondary | ICD-10-CM | POA: Insufficient documentation

## 2017-11-04 DIAGNOSIS — M25511 Pain in right shoulder: Secondary | ICD-10-CM | POA: Insufficient documentation

## 2017-11-04 MED ORDER — IBUPROFEN 800 MG PO TABS: 800.0000 mg | ORAL_TABLET | Freq: Three times a day (TID) | ORAL | 0 refills | Status: AC

## 2017-11-04 MED ORDER — IBUPROFEN 800 MG PO TABS
800.0000 mg | ORAL_TABLET | Freq: Once | ORAL | Status: AC
  Administered 2017-11-04: 800 mg via ORAL
  Filled 2017-11-04: qty 1

## 2017-11-04 MED ORDER — CYCLOBENZAPRINE HCL 5 MG PO TABS: 5.0000 mg | ORAL_TABLET | Freq: Three times a day (TID) | ORAL | 0 refills | Status: AC | PRN

## 2017-11-04 MED ORDER — CYCLOBENZAPRINE HCL 10 MG PO TABS
5.0000 mg | ORAL_TABLET | Freq: Once | ORAL | Status: AC
  Administered 2017-11-04: 5 mg via ORAL
  Filled 2017-11-04: qty 1

## 2017-11-04 NOTE — ED Provider Notes (Signed)
[facility]-EMERGENCY DEPT Provider Note  CSN: [id number] Arrival date & time: 11/04/17  4098  History   Chief Complaint Chief Complaint  Patient presents with  . right side of body pain  . Sore Throat   HPI Stacey Roth is a 32 y.o. female with no significant medical history who presented to the ED for right upper extremity pain x 2 days. She describes muscle tightness and ache that radiates from her neck to her wrist. Denies any precipitating injuries, traumas or accidents prior to the pain starting. She states that it is painful to move her arm. Patient has tried nothing prior to coming to the ED. Denies issues with back or other extremities.  Denies paresthesias or weakness.  History reviewed. No pertinent past medical history.  There are no active problems to display for this patient.  History reviewed. No pertinent surgical history.   OB History   None    Home Medications    Prior to Admission medications   Medication Sig Start Date End Date Taking? Authorizing Provider  bisacodyl (DULCOLAX) 5 MG EC tablet Take 1 tablet (5 mg total) by mouth 1 day or 1 dose. Patient not taking: Reported on 07/10/2017 03/10/17   Dartha Lodge, PA-C  cyclobenzaprine (FLEXERIL) 5 MG tablet Take 1 tablet (5 mg total) by mouth 3 (three) times daily as needed for up to 14 days for muscle spasms. 11/04/17 11/18/17  Mortis, Jerrel Ivory I, PA-C  diclofenac (VOLTAREN) 75 MG EC tablet Take 1 tablet (75 mg total) by mouth 2 (two) times daily. 07/10/17   Lake, Christoper P, MD  guaiFENesin-dextromethorphan (ROBITUSSIN DM) 100-10 MG/5ML syrup Take 5 mLs by mouth 3 (three) times daily as needed for cough. 08/13/17   Benjiman Core, MD  HYDROcodone-acetaminophen (NORCO/VICODIN) 5-325 MG tablet Take 1-2 tablets by mouth every 6 hours as needed for pain and/or cough. Patient not taking: Reported on 07/10/2017 07/29/16   Pisciotta, Joni Reining, PA-C  hydrocortisone (ANUSOL-HC) 2.5 % rectal cream  Place 1 application rectally 2 (two) times daily. Patient not taking: Reported on 07/10/2017 03/10/17   Dartha Lodge, PA-C  ibuprofen (ADVIL,MOTRIN) 800 MG tablet Take 1 tablet (800 mg total) by mouth 3 (three) times daily. 11/04/17 12/04/17  Mortis, Jerrel Ivory I, PA-C  lidocaine (XYLOCAINE) 5 % ointment Apply 1 application topically as needed. Patient not taking: Reported on 07/10/2017 03/10/17   Dartha Lodge, PA-C  oseltamivir (TAMIFLU) 75 MG capsule Take 1 capsule (75 mg total) by mouth every 12 (twelve) hours. Patient not taking: Reported on 07/10/2017 07/29/16   Pisciotta, Joni Reining, PA-C  promethazine (PHENERGAN) 25 MG tablet Take 1 tablet (25 mg total) by mouth every 6 (six) hours as needed for nausea or vomiting. Patient not taking: Reported on 07/10/2017 07/29/16   Pisciotta, Joni Reining, PA-C    Family History No family history on file.  Social History Social History   Tobacco Use  . Smoking status: Current Every Day Smoker    Types: Cigarettes  . Smokeless tobacco: Never Used  Substance Use Topics  . Alcohol use: Yes    Comment: occasional   . Drug use: Not on file     Allergies   Lac bovis   Review of Systems Review of Systems  Constitutional: Negative for activity change, chills and fever.  HENT: Positive for sore throat. Negative for postnasal drip and rhinorrhea.   Respiratory: Negative.   Cardiovascular: Negative.   Musculoskeletal: Positive for arthralgias, myalgias and neck pain. Negative for gait problem, joint  swelling and neck stiffness.  Skin: Negative.   Neurological: Negative.      Physical Exam Updated Vital Signs BP (!) 121/95 (BP Location: Left Arm)   Pulse 81   Temp 98.2 F (36.8 C) (Oral)   Resp 18   LMP 10/07/2017   SpO2 100%   Physical Exam  Constitutional: She appears well-developed and well-nourished.  Seen walking in the hallway due to pain and unable to get comfortable.  HENT:  Head: Normocephalic and atraumatic.  Mouth/Throat: Mucous  membranes are normal. No tonsillar exudate.  Eyes: Pupils are equal, round, and reactive to light. EOM are normal.  Neck: Normal range of motion. Neck supple.  Cardiovascular: Intact distal pulses.  Musculoskeletal:       Right shoulder: She exhibits tenderness and spasm. She exhibits no crepitus and no deformity.       Right elbow: She exhibits normal range of motion, no swelling and no deformity.       Right wrist: She exhibits tenderness. She exhibits no effusion and no deformity.       Cervical back: She exhibits tenderness and spasm. She exhibits normal range of motion.  Patient has full ROM of right upper extremity, but endorses pain with movements. Muscle spasm is appreciated on right shoulder and cervical spinal muscles.  Lymphadenopathy:    She has no cervical adenopathy.  Neurological: No sensory deficit. She exhibits normal muscle tone. Gait normal.  Reflex Scores:      Bicep reflexes are 2+ on the right side and 2+ on the left side.      Brachioradialis reflexes are 2+ on the right side and 2+ on the left side. Decreased strength in right upper extremity due to pain.  Skin: Skin is warm and intact. No bruising noted.  Nursing note and vitals reviewed.    ED Treatments / Results  Labs (all labs ordered are listed, but only abnormal results are displayed) Labs Reviewed - No data to display  EKG None  Radiology No results found.  Procedures Procedures (including critical care time)  Medications Ordered in ED Medications  cyclobenzaprine (FLEXERIL) tablet 5 mg (5 mg Oral Given 11/04/17 0953)  ibuprofen (ADVIL,MOTRIN) tablet 800 mg (800 mg Oral Given 11/04/17 0953)    Initial Impression / Assessment and Plan / ED Course  Triage vital signs and the nursing notes have been reviewed.  Pertinent labs & imaging results that were available during care of the patient were reviewed and considered in medical decision making (see chart for details).  Clinical Course as of  Nov 04 1029  Wed Nov 04, 2017  1017 Flexeril 5mg  + ibuprofen 800mg  x1 given in the ED for muscle spasm and pain. Pt reassessed and there has been some relief.   [GM]    Clinical Course User Index [GM] Reva BoresMortis, Gabrielle I, PA-C   Physical exam is remarkable for palpable muscle spasm of right cervical and shoulder muscles. Reassuring that she has full ROM. Patient does not have any risk factors such as DM or alcohol use that would put her at increased risk for osteopenia. No warmth, redness, rashes or other systemic s/s to suggest infectious or rheumatologic etiology. No bony tenderness, deformities or ecchymosis appreciated to warrant imaging of the extremity. Relief achieved in the ED with muscle relaxant and NSAID.  Final Clinical Impressions(s) / ED Diagnoses  1. Muscle Spasm. Rx for Flexeril 5mg  TID PRN and ibuprofen 800mg  Q8H PRN. Advised to follow-up with PCP who can refer to ortho. Education  provided on supportive treatment options.  Dispo: Home. After thorough clinical evaluation, this patient is determined to be medically stable and can be safely discharged with the previously mentioned treatment and/or outpatient follow-up/referral(s). At this time, there are no other apparent medical conditions that require further screening, evaluation or treatment.  Final diagnoses:  Muscle spasm    ED Discharge Orders        Ordered    cyclobenzaprine (FLEXERIL) 5 MG tablet  3 times daily PRN     11/04/17 1026    ibuprofen (ADVIL,MOTRIN) 800 MG tablet  3 times daily     11/04/17 [address] [place] I, PA-C 11/04/17 1045    Derwood Kaplan, MD 11/04/17 1637

## 2017-11-04 NOTE — Discharge Instructions (Signed)
Take the Flexeril and ibuprofen daily for 2 weeks as prescribed. Hot compresses can also be used for additional relief.  Follow-up with you PCP to discuss muscle spasms. He/she can refer you to orthopedics if necessary.

## 2017-11-04 NOTE — ED Triage Notes (Signed)
Pt reports that she got back from trip yesterday and started having right arm pain that goes from her shoulder down her right arm and then pains on her right leg. Pt reports that pains got worse now that she has to go back to work. 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 Reports that she has tendonitis.  Also co sore throat and loosing her voice.

## 2017-11-04 NOTE — ED Notes (Signed)
Patient a/o x4, and ambulatory upon discharge. Pt given heat pack by request.

## 2018-05-24 ENCOUNTER — Emergency Department (HOSPITAL_COMMUNITY)
Admission: EM | Admit: 2018-05-24 | Discharge: 2018-05-24 | Disposition: A | Discharge status: Home / Self Care | Payer: Self-pay | Attending: Emergency Medicine | Admitting: Emergency Medicine

## 2018-05-24 ENCOUNTER — Emergency Department (HOSPITAL_COMMUNITY): Payer: Self-pay

## 2018-05-24 ENCOUNTER — Encounter (HOSPITAL_COMMUNITY): Payer: Self-pay | Admitting: *Deleted

## 2018-05-24 DIAGNOSIS — B3731 Acute candidiasis of vulva and vagina: Secondary | ICD-10-CM

## 2018-05-24 DIAGNOSIS — Z79899 Other long term (current) drug therapy: Secondary | ICD-10-CM | POA: Insufficient documentation

## 2018-05-24 DIAGNOSIS — M25531 Pain in right wrist: Secondary | ICD-10-CM | POA: Insufficient documentation

## 2018-05-24 DIAGNOSIS — Z3202 Encounter for pregnancy test, result negative: Secondary | ICD-10-CM | POA: Insufficient documentation

## 2018-05-24 DIAGNOSIS — N760 Acute vaginitis: Secondary | ICD-10-CM | POA: Insufficient documentation

## 2018-05-24 DIAGNOSIS — F17210 Nicotine dependence, cigarettes, uncomplicated: Secondary | ICD-10-CM | POA: Insufficient documentation

## 2018-05-24 DIAGNOSIS — B373 Candidiasis of vulva and vagina: Secondary | ICD-10-CM | POA: Insufficient documentation

## 2018-05-24 DIAGNOSIS — B9689 Other specified bacterial agents as the cause of diseases classified elsewhere: Secondary | ICD-10-CM | POA: Insufficient documentation

## 2018-05-24 LAB — URINALYSIS, ROUTINE W REFLEX MICROSCOPIC
Bilirubin Urine: NEGATIVE
Glucose, UA: NEGATIVE mg/dL
Hgb urine dipstick: NEGATIVE
Ketones, ur: NEGATIVE mg/dL
LEUKOCYTES UA: NEGATIVE
NITRITE: NEGATIVE
PH: 5 (ref 5.0–8.0)
Protein, ur: NEGATIVE mg/dL
SPECIFIC GRAVITY, URINE: 1.021 (ref 1.005–1.030)

## 2018-05-24 LAB — BASIC METABOLIC PANEL
Anion gap: 7 (ref 5–15)
BUN: 11 mg/dL (ref 6–20)
CO2: 25 mmol/L (ref 22–32)
Calcium: 9.6 mg/dL (ref 8.9–10.3)
Chloride: 105 mmol/L (ref 98–111)
Creatinine, Ser: 0.87 mg/dL (ref 0.44–1.00)
GFR calc Af Amer: >60 mL/min (ref >60)
GLUCOSE: 84 mg/dL (ref 70–99)
POTASSIUM: 4.1 mmol/L (ref 3.5–5.1)
Sodium: 137 mmol/L (ref 135–145)

## 2018-05-24 LAB — WET PREP, GENITAL
SPERM: NONE SEEN
TRICH WET PREP: NONE SEEN

## 2018-05-24 LAB — CBC WITH DIFFERENTIAL/PLATELET
ABS IMMATURE GRANULOCYTES: 0.03 10*3/uL (ref 0.00–0.07)
BASOS PCT: 1 %
Basophils Absolute: 0 10*3/uL (ref 0.0–0.1)
EOS ABS: 0.3 10*3/uL (ref 0.0–0.5)
Eosinophils Relative: 4 %
HCT: 44.1 % (ref 36.0–46.0)
Hemoglobin: 13.7 g/dL (ref 12.0–15.0)
Immature Granulocytes: 0 %
Lymphocytes Relative: 35 %
Lymphs Abs: 2.6 10*3/uL (ref 0.7–4.0)
MCH: 27.7 pg (ref 26.0–34.0)
MCHC: 31.1 g/dL (ref 30.0–36.0)
MCV: 89.1 fL (ref 80.0–100.0)
MONO ABS: 0.6 10*3/uL (ref 0.1–1.0)
MONOS PCT: 8 %
Neutro Abs: 4 10*3/uL (ref 1.7–7.7)
Neutrophils Relative %: 52 %
PLATELETS: 266 10*3/uL (ref 150–400)
RBC: 4.95 MIL/uL (ref 3.87–5.11)
RDW: 15.4 % (ref 11.5–15.5)
WBC: 7.4 10*3/uL (ref 4.0–10.5)
nRBC: 0 % (ref 0.0–0.2)

## 2018-05-24 LAB — PREGNANCY, URINE: Preg Test, Ur: NEGATIVE

## 2018-05-24 MED ORDER — IBUPROFEN 800 MG PO TABS
800.0000 mg | ORAL_TABLET | Freq: Once | ORAL | Status: AC
  Administered 2018-05-24: 800 mg via ORAL
  Filled 2018-05-24: qty 1

## 2018-05-24 MED ORDER — METRONIDAZOLE 0.75 % VA GEL: 1.0000 | Freq: Two times a day (BID) | VAGINAL | 0 refills | Status: AC

## 2018-05-24 MED ORDER — NAPROXEN 500 MG PO TABS: 500.0000 mg | ORAL_TABLET | Freq: Two times a day (BID) | ORAL | 0 refills | Status: AC

## 2018-05-24 MED ORDER — FLUCONAZOLE 150 MG PO TABS: 150.0000 mg | ORAL_TABLET | Freq: Every day | ORAL | 0 refills | Status: AC

## 2018-05-24 NOTE — ED Triage Notes (Signed)
Pt complains of increased urinary frequency, abdominal cramping and vaginal spasms. Pt states it feels like menstrual cramping but worse.

## 2018-05-24 NOTE — ED Notes (Signed)
Patient given discharge teaching and verbalized understanding. Patient ambulated out of ED with a steady gait. 

## 2018-05-24 NOTE — ED Notes (Signed)
Bed: WA16 Expected date:  Expected time:  Means of arrival:  Comments: 

## 2018-05-24 NOTE — Discharge Instructions (Signed)
Use MetroGel as prescribed. Use Diflucan for yeast infection. Take naproxen 2 times a day with meals as needed for pain.  Do not take other anti-inflammatories at the same time (Advil, Motrin, ibuprofen, Aleve). You may supplement with Tylenol if you need further pain control. Use heating pads to help with your abdominal cramping. Follow-up with OB/GYN for further evaluation of your yeast infections and vaginal cramping. Follow-up with primary care for further management of your wrist pain. Make sure you are wearing the wrist splint at night when you sleep. Return to the emergency room with any new, worsening, concerning symptoms.

## 2018-05-24 NOTE — ED Provider Notes (Signed)
[facility]-EMERGENCY DEPT Provider Note   CSN: [id number] Arrival date & time: 05/24/18  1051     History   Chief Complaint Chief Complaint  Patient presents with  . Urinary Frequency  . Abdominal Cramping    HPI Stacey Roth is a 32 y.o. female presenting for evaluation of suprapubic cramping, urinary frequency, and right wrist pain.  Patient states today, she developed suprapubic discomfort.  It is described as a cramping sensation which radiates to her vagina.  It is intermittent.  She has not taken anything for it including Tylenol or ibuprofen.  Patient states her periods have been irregular.  Her last period was a month and a half ago.  She is sexually active with one female partner.  She does not use hormones or contraception.  She denies vaginal bleeding or vaginal discharge.  Patient reports urinary frequency, but denies dysuria, hematuria, or abnormal smell/odor. Additionally, patient reports increasing pain of her right wrist.  She states she has had tendinitis of this wrist before.  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  She is right-handed.  She denies fall, trauma, or injury.  She has not been taking anything for her pain.  She has a brace, but does not use it.  She thinks she has been sleeping on it funny.  She denies numbness or tingling.  She denies denies pain elsewhere.  HPI  History reviewed. No pertinent past medical history.  There are no active problems to display for this patient.   History reviewed. No pertinent surgical history.   OB History   No obstetric history on file.      Home Medications    Prior to Admission medications   Medication Sig Start Date End Date Taking? Authorizing Provider  ibuprofen (ADVIL,MOTRIN) 200 MG tablet Take 400 mg by mouth every 6 (six) hours as needed for moderate pain.   Yes [provider]  bisacodyl (DULCOLAX) 5 MG EC tablet Take 1 tablet (5 mg total) by mouth 1 day or 1  dose. Patient not taking: Reported on 07/10/2017 03/10/17   Dartha Lodge, PA-C  diclofenac (VOLTAREN) 75 MG EC tablet Take 1 tablet (75 mg total) by mouth 2 (two) times daily. Patient not taking: Reported on 05/24/2018 07/10/17   Lake, Christoper P, MD  fluconazole (DIFLUCAN) 150 MG tablet Take 1 tablet (150 mg total) by mouth daily for 1 day. 05/24/18 05/25/18  Ceciley Buist, PA-C  guaiFENesin-dextromethorphan (ROBITUSSIN DM) 100-10 MG/5ML syrup Take 5 mLs by mouth 3 (three) times daily as needed for cough. Patient not taking: Reported on 05/24/2018 08/13/17   Benjiman Core, MD  HYDROcodone-acetaminophen (NORCO/VICODIN) 5-325 MG tablet Take 1-2 tablets by mouth every 6 hours as needed for pain and/or cough. Patient not taking: Reported on 07/10/2017 07/29/16   Pisciotta, Joni Reining, PA-C  hydrocortisone (ANUSOL-HC) 2.5 % rectal cream Place 1 application rectally 2 (two) times daily. Patient not taking: Reported on 07/10/2017 03/10/17   Dartha Lodge, PA-C  lidocaine (XYLOCAINE) 5 % ointment Apply 1 application topically as needed. Patient not taking: Reported on 07/10/2017 03/10/17   Dartha Lodge, PA-C  metroNIDAZOLE (METROGEL VAGINAL) 0.75 % vaginal gel Place 1 Applicatorful vaginally 2 (two) times daily for 5 days. 05/24/18 05/29/18  Channell Quattrone, PA-C  naproxen (NAPROSYN) 500 MG tablet Take 1 tablet (500 mg total) by mouth 2 (two) times daily with a meal for 7 days. 05/24/18 05/31/18  Teesha Ohm, PA-C  oseltamivir (TAMIFLU) 75 MG capsule Take  1 capsule (75 mg total) by mouth every 12 (twelve) hours. Patient not taking: Reported on 07/10/2017 07/29/16   Pisciotta, Joni ReiningNicole, PA-C  promethazine (PHENERGAN) 25 MG tablet Take 1 tablet (25 mg total) by mouth every 6 (six) hours as needed for nausea or vomiting. Patient not taking: Reported on 07/10/2017 07/29/16   Pisciotta, Joni ReiningNicole, PA-C    Family History No family history on file.  Social History Social History   Tobacco Use  . Smoking  status: Current Every Day Smoker    Types: Cigarettes  . Smokeless tobacco: Never Used  Substance Use Topics  . Alcohol use: Yes    Comment: occasional   . Drug use: Not on file     Allergies   Lac bovis   Review of Systems Review of Systems  Genitourinary: Positive for frequency and pelvic pain.  Musculoskeletal: Positive for arthralgias and myalgias.  All other systems reviewed and are negative.    Physical Exam Updated Vital Signs BP (!) 137/102   Pulse (!) 59   Temp 98.6 F (37 C) (Oral)   Resp 15   LMP 04/24/2018 (LMP Unknown)   SpO2 100%   Physical Exam Vitals signs and nursing note reviewed. Exam conducted with a chaperone present.  Constitutional:      General: She is not in acute distress.    Appearance: She is well-developed.     Comments: Appears nontoxic  HENT:     Head: Normocephalic and atraumatic.  Eyes:     Conjunctiva/sclera: Conjunctivae normal.     Pupils: Pupils are equal, round, and reactive to light.  Neck:     Musculoskeletal: Normal range of motion and neck supple.  Cardiovascular:     Rate and Rhythm: Normal rate and regular rhythm.  Pulmonary:     Effort: Pulmonary effort is normal. No respiratory distress.     Breath sounds: Normal breath sounds. No wheezing.  Abdominal:     General: There is no distension.     Palpations: Abdomen is soft.     Tenderness: There is no abdominal tenderness.  Genitourinary:    General: Normal vulva.     Exam position: Supine.     Labia:        Right: No rash, tenderness, lesion or injury.        Left: No rash, tenderness, lesion or injury.      Vagina: Normal.     Cervix: Normal.     Uterus: Normal.      Adnexa:        Right: Tenderness present.      Comments: Right-sided adnexal tenderness.  No CMT.  No obvious masses.  Thin white discharge noted on exam. Musculoskeletal: Normal range of motion.        General: Tenderness present.     Comments: Tenderness to palpation of dorsal right wrist.   No obvious swelling or deformity.  Full active range of motion of the wrist.  Radial pulses intact bilaterally.  Grip strength intact bilaterally.  Full active range of motion of the elbow.  Skin:    General: Skin is warm and dry.     Capillary Refill: Capillary refill takes less than 2 seconds.  Neurological:     Mental Status: She is alert and oriented to person, place, and time.      ED Treatments / Results  Labs (all labs ordered are listed, but only abnormal results are displayed) Labs Reviewed  WET PREP, GENITAL - Abnormal; Notable for the following  components:      Result Value   Yeast Wet Prep HPF POC PRESENT (*)    Clue Cells Wet Prep HPF POC PRESENT (*)    WBC, Wet Prep HPF POC RARE (*)    All other components within normal limits  URINALYSIS, ROUTINE W REFLEX MICROSCOPIC  PREGNANCY, URINE  CBC WITH DIFFERENTIAL/PLATELET  BASIC METABOLIC PANEL  GC/CHLAMYDIA PROBE AMP ([place]) NOT AT [facility]    EKG None  Radiology [place] Transvaginal Non-ob  Result Date: 05/24/2018 CLINICAL DATA:  Right-sided pelvic pain EXAM: TRANSABDOMINAL AND TRANSVAGINAL ULTRASOUND OF PELVIS DOPPLER ULTRASOUND OF OVARIES TECHNIQUE: Study was performed transabdominally to optimize pelvic field of view evaluation and transvaginally to optimize internal visceral architecture evaluation. Color and duplex Doppler ultrasound was utilized to evaluate blood flow to the ovaries. COMPARISON:  None. FINDINGS: Uterus Measurements: 7.8 x 3.9 x 4.7 cm = volume: 73.1 mL. No fibroids or other mass visualized. Endometrium Thickness: 10 mm.  No focal abnormality visualized. Right ovary Measurements: 2.6 x 1.4 x 2.0 cm = volume: 3.9 mL. Normal appearance/no adnexal mass. Left ovary Measurements: 2.4 x 1.9 x 1.7 cm = volume: 4.0 mL. Normal appearance/no adnexal mass. Pulsed Doppler evaluation of both ovaries demonstrates normal low-resistance arterial and venous waveforms. Other findings No abnormal free fluid. IMPRESSION:  Study within normal limits. No demonstrable ovarian torsion. No intrauterine or extrauterine pelvic mass or inflammatory focus. Electronically Signed   By: Bretta BangWilliam  Woodruff III M.D.   On: 05/24/2018 17:21   [place] Pelvis Complete  Result Date: 05/24/2018 CLINICAL DATA:  Right-sided pelvic pain EXAM: TRANSABDOMINAL AND TRANSVAGINAL ULTRASOUND OF PELVIS DOPPLER ULTRASOUND OF OVARIES TECHNIQUE: Study was performed transabdominally to optimize pelvic field of view evaluation and transvaginally to optimize internal visceral architecture evaluation. Color and duplex Doppler ultrasound was utilized to evaluate blood flow to the ovaries. COMPARISON:  None. FINDINGS: Uterus Measurements: 7.8 x 3.9 x 4.7 cm = volume: 73.1 mL. No fibroids or other mass visualized. Endometrium Thickness: 10 mm.  No focal abnormality visualized. Right ovary Measurements: 2.6 x 1.4 x 2.0 cm = volume: 3.9 mL. Normal appearance/no adnexal mass. Left ovary Measurements: 2.4 x 1.9 x 1.7 cm = volume: 4.0 mL. Normal appearance/no adnexal mass. Pulsed Doppler evaluation of both ovaries demonstrates normal low-resistance arterial and venous waveforms. Other findings No abnormal free fluid. IMPRESSION: Study within normal limits. No demonstrable ovarian torsion. No intrauterine or extrauterine pelvic mass or inflammatory focus. Electronically Signed   By: Bretta BangWilliam  Woodruff III M.D.   On: 05/24/2018 17:21   [place] Art/ven Flow Abd Pelv Doppler  Result Date: 05/24/2018 CLINICAL DATA:  Right-sided pelvic pain EXAM: TRANSABDOMINAL AND TRANSVAGINAL ULTRASOUND OF PELVIS DOPPLER ULTRASOUND OF OVARIES TECHNIQUE: Study was performed transabdominally to optimize pelvic field of view evaluation and transvaginally to optimize internal visceral architecture evaluation. Color and duplex Doppler ultrasound was utilized to evaluate blood flow to the ovaries. COMPARISON:  None. FINDINGS: Uterus Measurements: 7.8 x 3.9 x 4.7 cm = volume: 73.1 mL. No fibroids or other  mass visualized. Endometrium Thickness: 10 mm.  No focal abnormality visualized. Right ovary Measurements: 2.6 x 1.4 x 2.0 cm = volume: 3.9 mL. Normal appearance/no adnexal mass. Left ovary Measurements: 2.4 x 1.9 x 1.7 cm = volume: 4.0 mL. Normal appearance/no adnexal mass. Pulsed Doppler evaluation of both ovaries demonstrates normal low-resistance arterial and venous waveforms. Other findings No abnormal free fluid. IMPRESSION: Study within normal limits. No demonstrable ovarian torsion. No intrauterine or extrauterine pelvic mass or inflammatory focus. Electronically  Signed   By: Bretta Bang III M.D.   On: 05/24/2018 17:21    Procedures Procedures (including critical care time)  Medications Ordered in ED Medications  ibuprofen (ADVIL,MOTRIN) tablet 800 mg (800 mg Oral Given 05/24/18 1522)     Initial Impression / Assessment and Plan / ED Course  I have reviewed the triage vital signs and the nursing notes.  Pertinent labs & imaging results that were available during my care of the patient were reviewed by me and considered in my medical decision making (see chart for details).     Patient presenting for evaluation of suprapubic cramping and right wrist pain.  Physical exam reassuring, she is afebrile not tachycardic.  Appears nontoxic.  Right wrist pain reproducible with palpation of the muscles.  No fall or injury, low suspicion for fracture dislocation.  Likely tendinitis, as patient has history of the same.  Discussed using brace and anti-inflammatories.  Patient encouraged to follow-up with primary care, resources given. Additionally, patient with suprapubic cramping.  UA negative for blood or infection.  Pregnancy negative.  On exam, patient with right adnexal tenderness, as such, will obtain ultrasound for further evaluation.  Ultrasound negative for torsion, TOA, or cyst.  Wet prep positive for yeast and BV.  Discussed findings with patient.  Will treat BV and yeast, gonorrhea  and chlamydia sent.  Patient to follow-up with OB/GYN for further evaluation.  Labs reassuring, no leukocytosis.  Pain is pelvic, not right lower quadrant.  Doubt appendicitis.  At this time, patient appears safe for discharge.  Return percussions given.  Patient states she understands and agrees plan.  Final Clinical Impressions(s) / ED Diagnoses   Final diagnoses:  BV (bacterial vaginosis)  Yeast vaginitis  Right wrist pain    ED Discharge Orders         Ordered    fluconazole (DIFLUCAN) 150 MG tablet  Daily     05/24/18 1814    metroNIDAZOLE (METROGEL VAGINAL) 0.75 % vaginal gel  2 times daily     05/24/18 1814    naproxen (NAPROSYN) 500 MG tablet  2 times daily with meals     05/24/18 1815           Alveria Apley, PA-C 05/24/18 2336    Azalia Bilis, MD 05/24/18 2346

## 2018-05-28 LAB — GC/CHLAMYDIA PROBE AMP (~~LOC~~) NOT AT ARMC
Chlamydia: NEGATIVE
Neisseria Gonorrhea: NEGATIVE

## 2018-06-10 ENCOUNTER — Encounter (HOSPITAL_COMMUNITY): Payer: Self-pay

## 2018-06-10 ENCOUNTER — Ambulatory Visit (HOSPITAL_COMMUNITY)
Admission: EM | Admit: 2018-06-10 | Discharge: 2018-06-10 | Disposition: A | Discharge status: Home / Self Care | Payer: Medicaid Other | Attending: Family Medicine | Admitting: Family Medicine

## 2018-06-10 DIAGNOSIS — R05 Cough: Secondary | ICD-10-CM

## 2018-06-10 DIAGNOSIS — R51 Headache: Secondary | ICD-10-CM

## 2018-06-10 DIAGNOSIS — J029 Acute pharyngitis, unspecified: Secondary | ICD-10-CM

## 2018-06-10 DIAGNOSIS — R69 Illness, unspecified: Secondary | ICD-10-CM

## 2018-06-10 DIAGNOSIS — Z20828 Contact with and (suspected) exposure to other viral communicable diseases: Secondary | ICD-10-CM

## 2018-06-10 DIAGNOSIS — M791 Myalgia, unspecified site: Secondary | ICD-10-CM

## 2018-06-10 DIAGNOSIS — J111 Influenza due to unidentified influenza virus with other respiratory manifestations: Secondary | ICD-10-CM

## 2018-06-10 DIAGNOSIS — R509 Fever, unspecified: Secondary | ICD-10-CM

## 2018-06-10 MED ORDER — OSELTAMIVIR PHOSPHATE 75 MG PO CAPS: 75.0000 mg | ORAL_CAPSULE | Freq: Two times a day (BID) | ORAL | 0 refills | Status: AC

## 2018-06-10 NOTE — Discharge Instructions (Addendum)
Take the tamilfu 2 x a day Push fluids Rest Ibuprofen or tylenol for pain and fever

## 2018-06-10 NOTE — ED Triage Notes (Signed)
Pt presents with complaints of headaches and fever x 2 days. Concerned for the flu.

## 2018-06-10 NOTE — ED Provider Notes (Signed)
[facility]    CSN: [id number] Arrival date & time: 06/10/18  0841     History   Chief Complaint Chief Complaint  Patient presents with  . Influenza    HPI Stacey Roth is a 33 y.o. female.   HPI Patient is here for headache, fever, body aches that started yesterday.  She was exposed to the flu.  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  Her symptoms started yesterday.  She went to the nurse at work and had a temperature of over 101.  Mild sore throat.  Mild cough.  No nausea or vomiting. Patient states she had influenza last spring in March.  She took Tamiflu without difficulty.  She states the symptoms feel similar.  History reviewed. No pertinent past medical history.  There are no active problems to display for this patient.   History reviewed. No pertinent surgical history.  OB History   No obstetric history on file.      Home Medications    Prior to Admission medications   Medication Sig Start Date End Date Taking? Authorizing Provider  ibuprofen (ADVIL,MOTRIN) 200 MG tablet Take 400 mg by mouth every 6 (six) hours as needed for moderate pain.    [provider]  oseltamivir (TAMIFLU) 75 MG capsule Take 1 capsule (75 mg total) by mouth every 12 (twelve) hours. 06/10/18   Eustace MooreNelson, Yvonne Sue, MD    Family History Family History  Problem Relation Age of Onset  . Hypertension Mother   . Cancer Mother     Social History Social History   Tobacco Use  . Smoking status: Current Every Day Smoker    Types: Cigarettes  . Smokeless tobacco: Never Used  Substance Use Topics  . Alcohol use: Yes    Comment: occasional   . Drug use: Not on file     Allergies   Lac bovis   Review of Systems Review of Systems  Constitutional: Positive for chills and fever.  HENT: Negative for ear pain and sore throat.   Eyes: Negative for pain and visual disturbance.  Respiratory:  Positive for cough. Negative for shortness of breath.   Cardiovascular: Negative for chest pain and palpitations.  Gastrointestinal: Negative for abdominal pain and vomiting.  Genitourinary: Negative for dysuria and hematuria.  Musculoskeletal: Positive for myalgias. Negative for arthralgias and back pain.  Skin: Negative for color change and rash.  Neurological: Negative for seizures and syncope.  All other systems reviewed and are negative.    Physical Exam Triage Vital Signs ED Triage Vitals  Enc Vitals Group     BP 06/10/18 0908 (!) 129/93     Pulse Rate 06/10/18 0908 83     Resp 06/10/18 0908 18     Temp 06/10/18 0908 98.4 F (36.9 C)     Temp src --      SpO2 06/10/18 0908 97 %     Weight --      Height --      Head Circumference --      Peak Flow --      Pain Score 06/10/18 0903 6     Pain Loc --      Pain Edu? --      Excl. in GC? --    No data found.  Updated Vital Signs BP (!) 129/93   Pulse 83   Temp 98.4 F (36.9 C)   Resp 18  LMP 05/26/2018   SpO2 97%        Physical Exam Constitutional:      General: She is not in acute distress.    Appearance: She is well-developed.  HENT:     Head: Normocephalic and atraumatic.     Nose: Rhinorrhea present.     Comments: Clear rhinorrhea.  Posterior pharynx mildly injected Eyes:     Conjunctiva/sclera: Conjunctivae normal.     Pupils: Pupils are equal, round, and reactive to light.  Neck:     Musculoskeletal: Normal range of motion.  Cardiovascular:     Rate and Rhythm: Normal rate and regular rhythm.     Heart sounds: Normal heart sounds.  Pulmonary:     Effort: Pulmonary effort is normal. No respiratory distress.     Comments: Lungs are clear Abdominal:     General: There is no distension.     Palpations: Abdomen is soft.  Musculoskeletal: Normal range of motion.  Lymphadenopathy:     Cervical: Cervical adenopathy present.  Skin:    General: Skin is warm and dry.  Neurological:     Mental  Status: She is alert.      UC Treatments / Results  Labs (all labs ordered are listed, but only abnormal results are displayed) Labs Reviewed - No data to display  EKG None  Radiology No results found.  Procedures Procedures (including critical care time)  Medications Ordered in UC Medications - No data to display  Initial Impression / Assessment and Plan / UC Course  I have reviewed the triage vital signs and the nursing notes.  Pertinent labs & imaging results that were available during my care of the patient were reviewed by me and considered in my medical decision making (see chart for details).     I discussed with the patient she has a flulike illness.  Since she tolerated Tamiflu before, and she desires treatment, will prescribe 75 mg twice daily.  Return for worsening symptoms. Final Clinical Impressions(s) / UC Diagnoses   Final diagnoses:  Influenza-like illness     Discharge Instructions     Take the tamilfu 2 x a day Push fluids Rest Ibuprofen or tylenol for pain and fever    ED Prescriptions    Medication Sig Dispense Auth. Provider   oseltamivir (TAMIFLU) 75 MG capsule Take 1 capsule (75 mg total) by mouth every 12 (twelve) hours. 10 capsule Eustace Moore, MD     Controlled Substance Prescriptions [place] Controlled Substance Registry consulted? Not Applicable   Eustace Moore, MD 06/10/18 2126

## 2018-11-23 IMAGING — DX DG CHEST 2V
2 series · 2 of 2 positions shown · non-contrast
Comparison: 07/29/2016

CLINICAL DATA: Central chest pain and shortness of breath for a
week.

EXAM:
CHEST  2 VIEW

[chest pa]
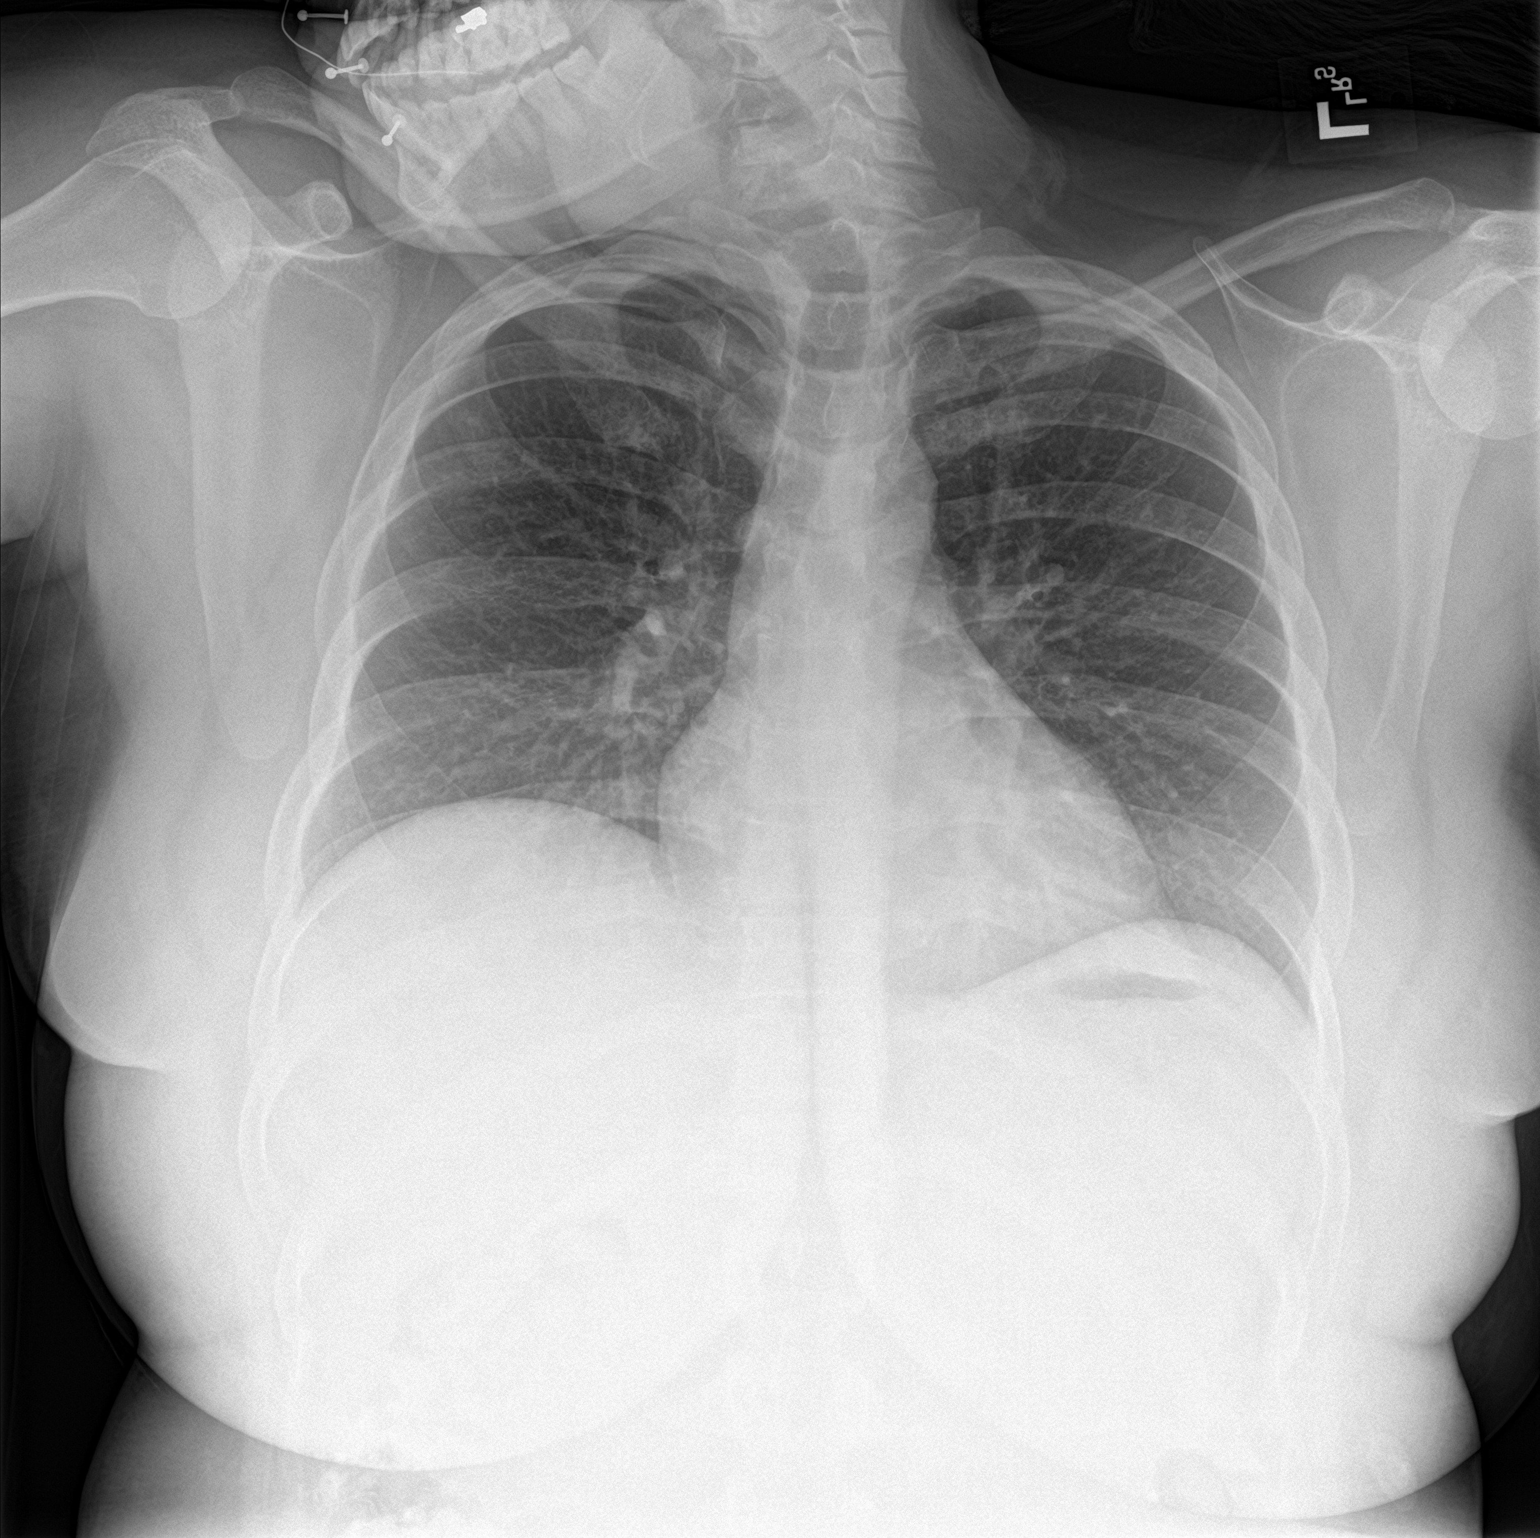

[chest lat]
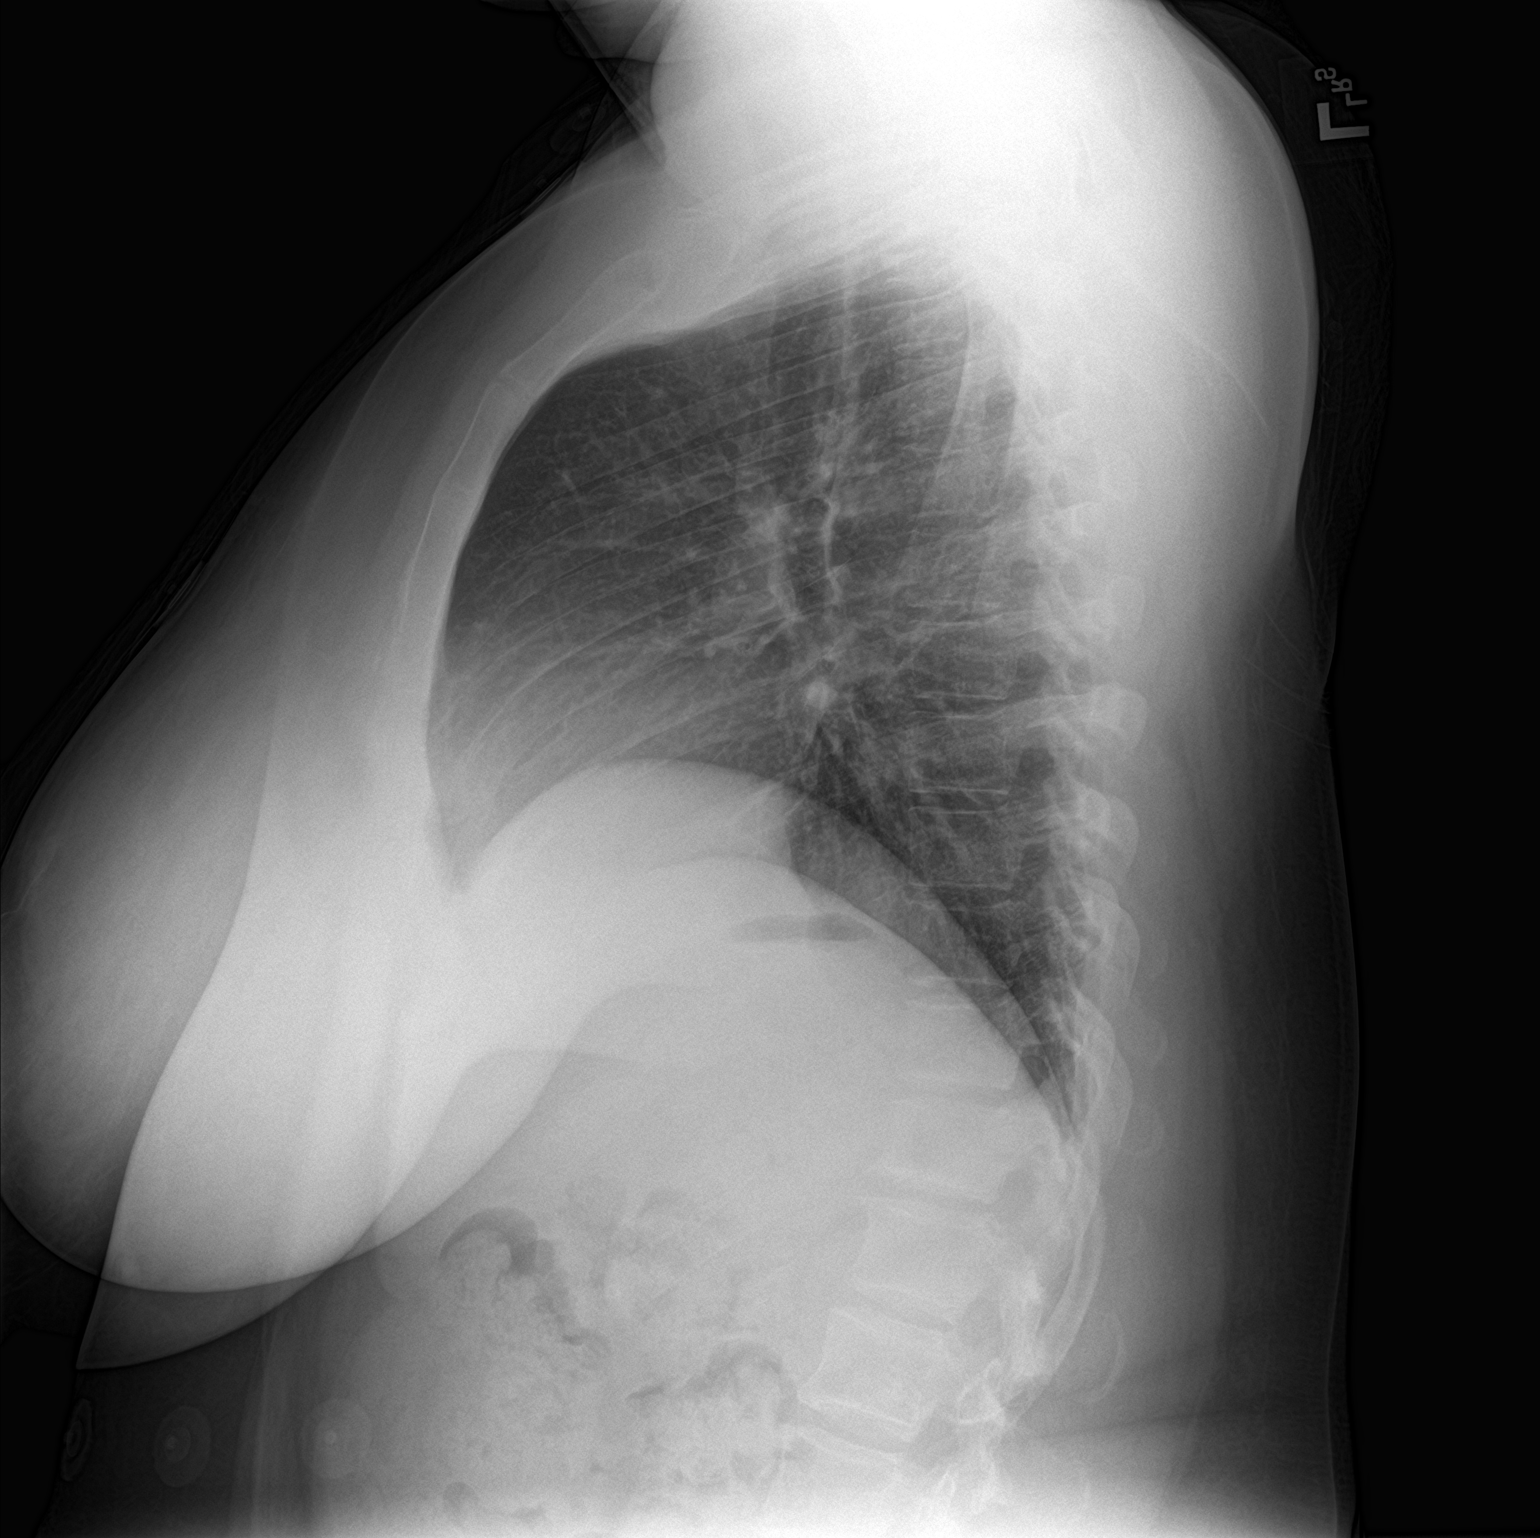

[2 of 2 positions shown; findings below may reference images not displayed]

FINDINGS: Shallow inspiration. The heart size and mediastinal contours are
within normal limits. Both lungs are clear. The visualized skeletal
structures are unremarkable.
IMPRESSION: No active cardiopulmonary disease.

## 2019-10-25 IMAGING — US US TRANSVAGINAL NON-OB
1 series · 14 of 25 positions shown · non-contrast
Comparison: None.

CLINICAL DATA: Right-sided pelvic pain

EXAM:
TRANSABDOMINAL AND TRANSVAGINAL ULTRASOUND OF PELVIS
DOPPLER ULTRASOUND OF OVARIES
TECHNIQUE: Study was performed transabdominally to optimize pelvic field of
view evaluation and transvaginally to optimize internal visceral
architecture evaluation.
Color and duplex Doppler ultrasound was utilized to evaluate blood
flow to the ovaries.

[Series 1: us transvaginal non-ob · 14 of 64 slices shown]
[im 1/64]
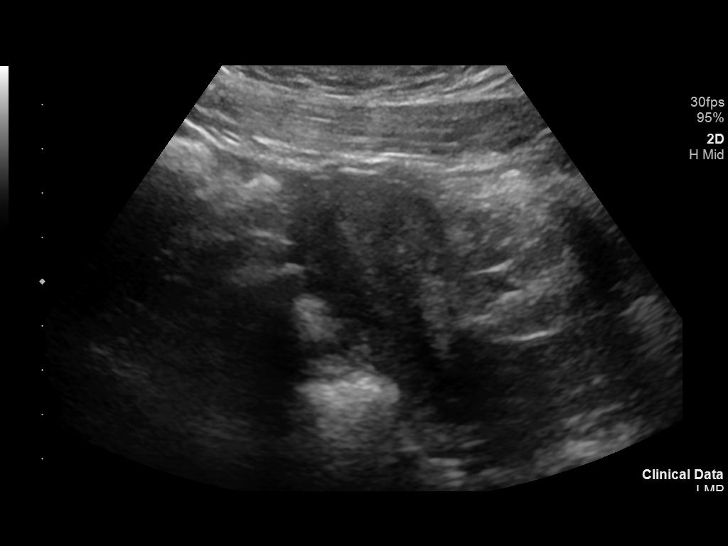
[im 6/64]
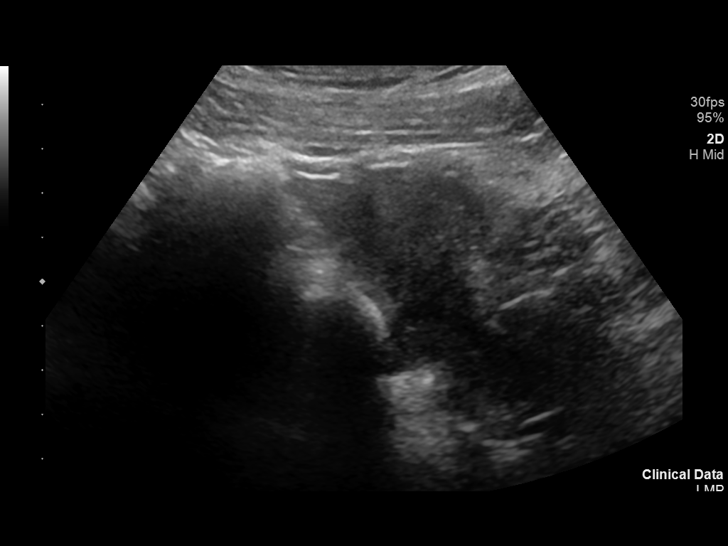
[im 11/64]
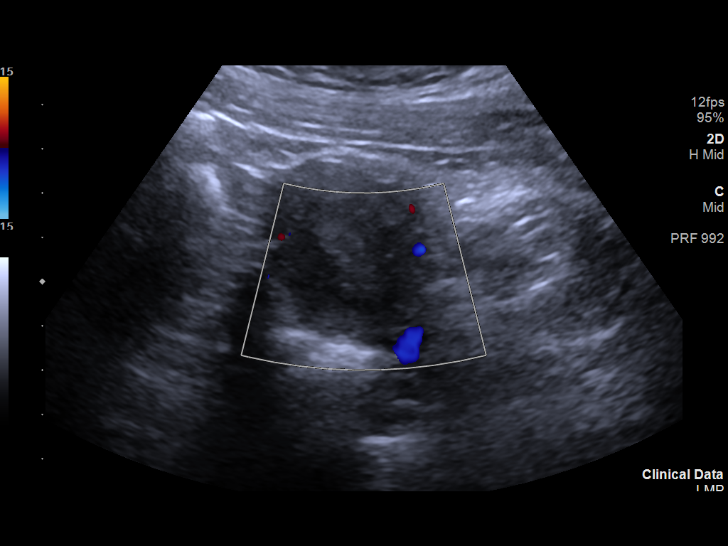
[im 16/64]
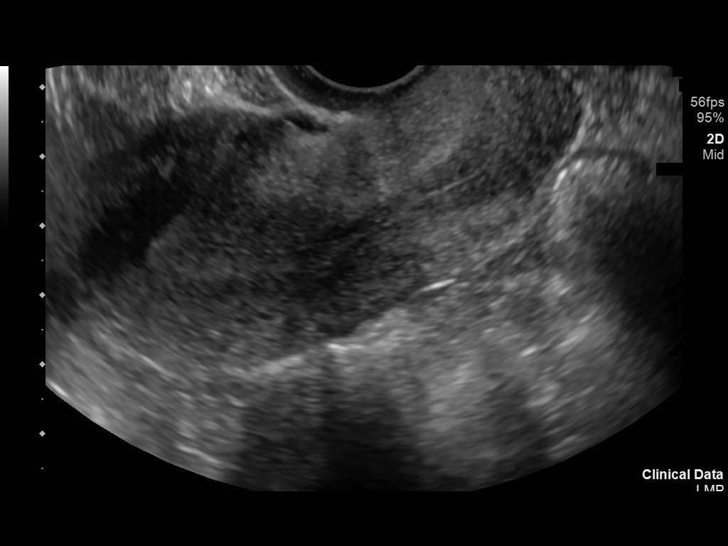
[im 22/64]
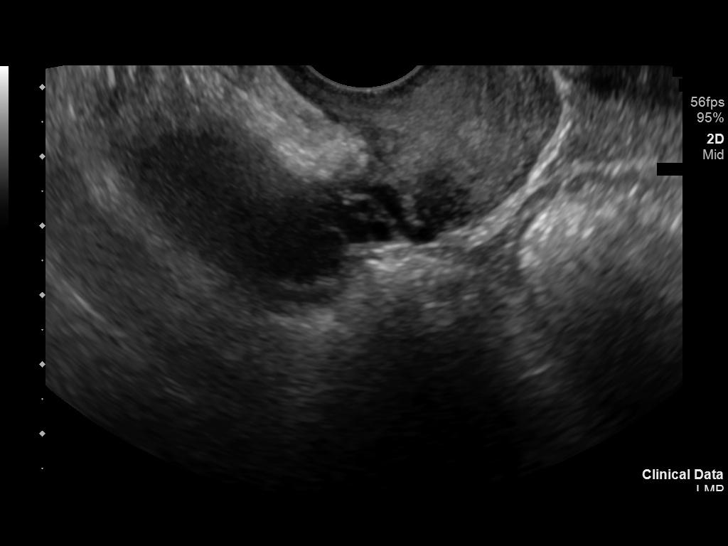
[im 24/64]
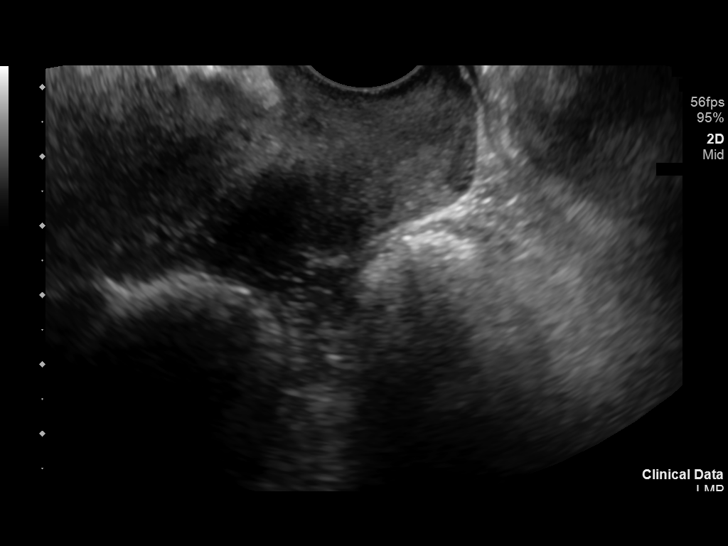
[im 29/64]
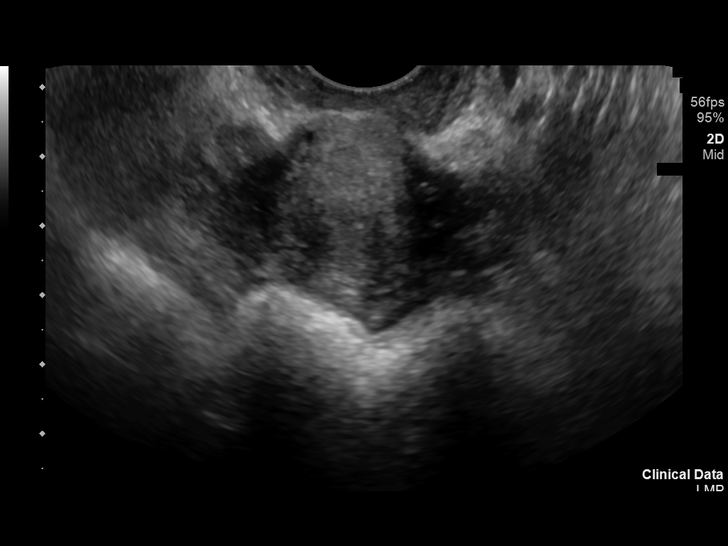
[im 35/64]
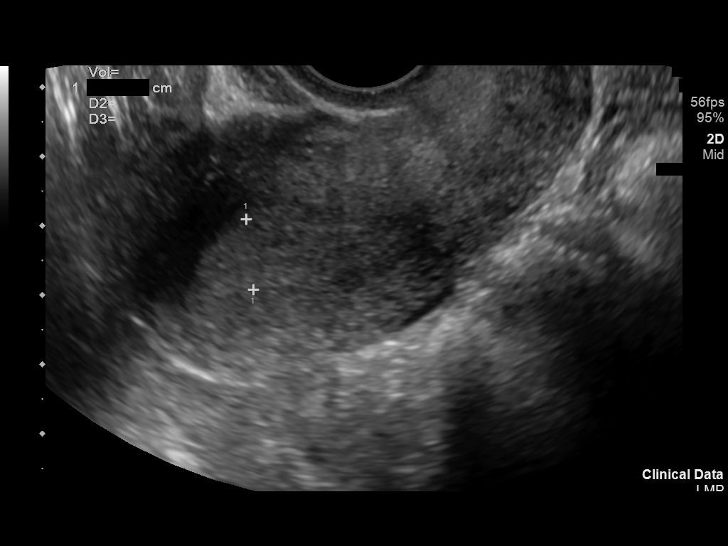
[im 40/64]
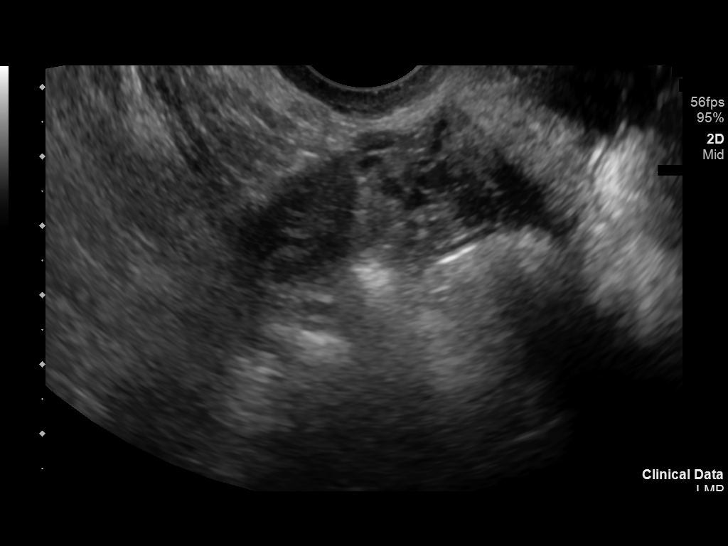
[im 43/64]
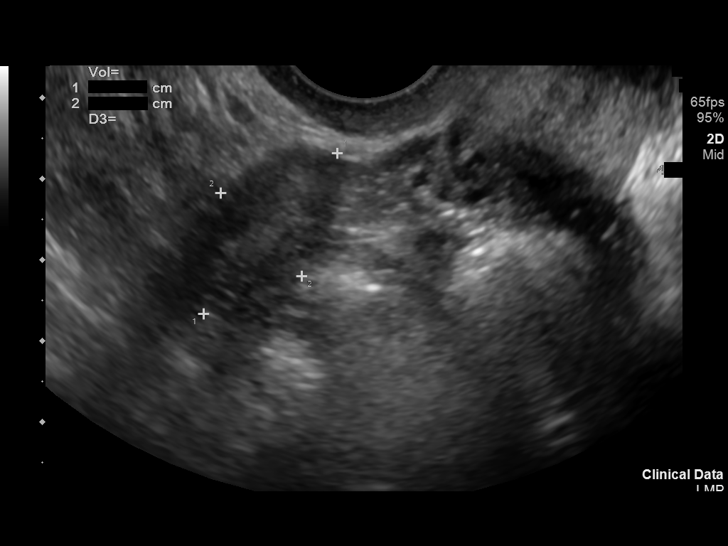
[im 48/64]
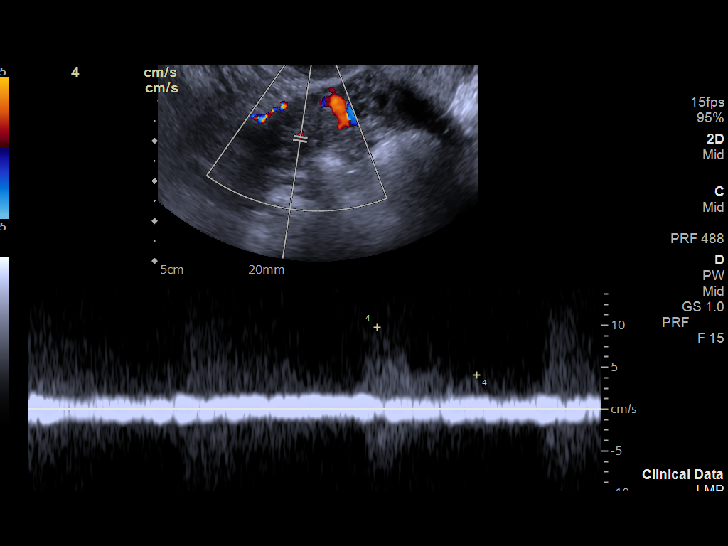
[im 53/64]
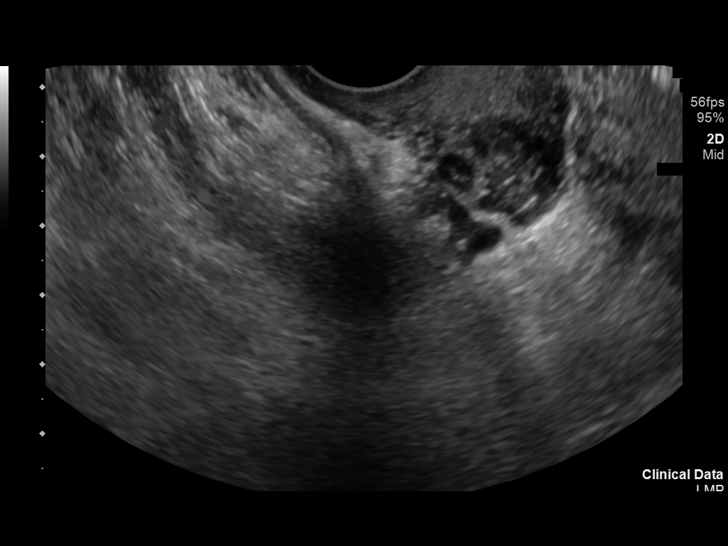
[im 58/64]
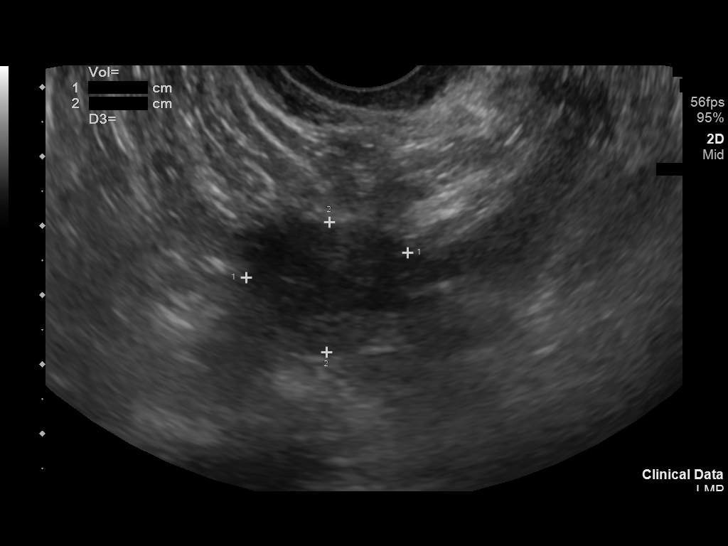
[im 64/64]
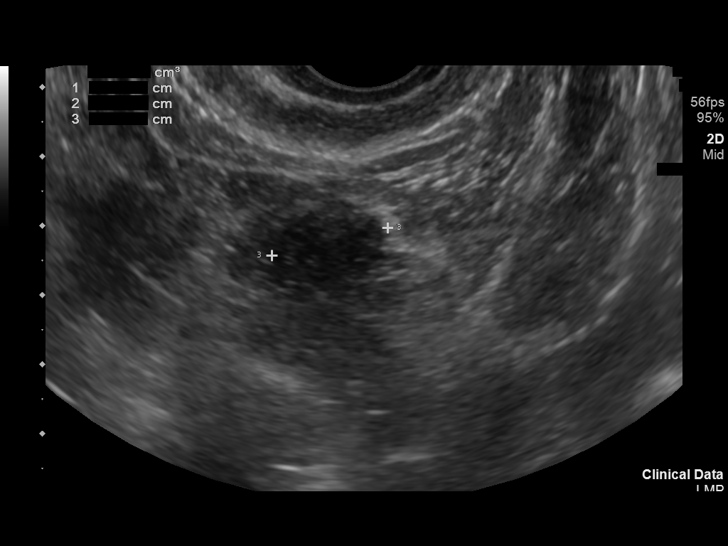

[14 of 25 positions shown; findings below may reference images not displayed]

FINDINGS: Uterus

Measurements: 7.8 x 3.9 x 4.7 cm = volume: 73.1 mL. No fibroids or
other mass visualized.

Endometrium

Thickness: 10 mm.  No focal abnormality visualized.

Right ovary

Measurements: 2.6 x 1.4 x 2.0 cm = volume: 3.9 mL. Normal
appearance/no adnexal mass.

Left ovary

Measurements: 2.4 x 1.9 x 1.7 cm = volume: 4.0 mL. Normal
appearance/no adnexal mass.

Pulsed Doppler evaluation of both ovaries demonstrates normal
low-resistance arterial and venous waveforms.

Other findings

No abnormal free fluid.
IMPRESSION: Study within normal limits. No demonstrable ovarian torsion. No
intrauterine or extrauterine pelvic mass or inflammatory focus.
